# Patient Record
Sex: Female | Born: 1975 | Race: Black or African American | Hispanic: No | Marital: Single | Smoking: Never smoker
Health system: Southern US, Community
[De-identification: ages and names within clinical notes are randomized; demographics above are authoritative.]

## PROBLEM LIST (undated history)

## (undated) DIAGNOSIS — R002 Palpitations: Secondary | ICD-10-CM

## (undated) DIAGNOSIS — D649 Anemia, unspecified: Secondary | ICD-10-CM

## (undated) DIAGNOSIS — F32A Depression, unspecified: Secondary | ICD-10-CM

## (undated) DIAGNOSIS — F419 Anxiety disorder, unspecified: Secondary | ICD-10-CM

## (undated) DIAGNOSIS — Z8489 Family history of other specified conditions: Secondary | ICD-10-CM

## (undated) DIAGNOSIS — J40 Bronchitis, not specified as acute or chronic: Secondary | ICD-10-CM

## (undated) DIAGNOSIS — I10 Essential (primary) hypertension: Secondary | ICD-10-CM

## (undated) DIAGNOSIS — M1990 Unspecified osteoarthritis, unspecified site: Secondary | ICD-10-CM

## (undated) DIAGNOSIS — J020 Streptococcal pharyngitis: Secondary | ICD-10-CM

## (undated) DIAGNOSIS — R011 Cardiac murmur, unspecified: Secondary | ICD-10-CM

## (undated) DIAGNOSIS — F329 Major depressive disorder, single episode, unspecified: Secondary | ICD-10-CM

## (undated) HISTORY — PX: OTHER SURGICAL HISTORY: SHX169

## (undated) HISTORY — PX: NO PAST SURGERIES: SHX2092

## (undated) HISTORY — PX: CORONARY ANGIOPLASTY: SHX604

---

## 1997-09-15 ENCOUNTER — Encounter: Admission: RE | Admit: 1997-09-15 | Discharge: 1997-09-15 | Payer: Self-pay | Admitting: Family Medicine

## 1997-10-25 ENCOUNTER — Encounter: Admission: RE | Admit: 1997-10-25 | Discharge: 1997-10-25 | Payer: Self-pay | Admitting: Family Medicine

## 1997-11-25 ENCOUNTER — Encounter: Admission: RE | Admit: 1997-11-25 | Discharge: 1997-11-25 | Payer: Self-pay | Admitting: Family Medicine

## 1997-11-30 ENCOUNTER — Encounter: Admission: RE | Admit: 1997-11-30 | Discharge: 1997-11-30 | Payer: Self-pay | Admitting: Family Medicine

## 1997-12-03 ENCOUNTER — Encounter: Admission: RE | Admit: 1997-12-03 | Discharge: 1997-12-03 | Payer: Self-pay | Admitting: Family Medicine

## 1998-01-06 ENCOUNTER — Encounter: Admission: RE | Admit: 1998-01-06 | Discharge: 1998-01-06 | Payer: Self-pay | Admitting: Family Medicine

## 1998-01-06 ENCOUNTER — Other Ambulatory Visit: Admission: RE | Admit: 1998-01-06 | Discharge: 1998-01-06 | Payer: Self-pay | Admitting: *Deleted

## 1998-02-14 ENCOUNTER — Encounter: Admission: RE | Admit: 1998-02-14 | Discharge: 1998-02-14 | Payer: Self-pay | Admitting: Family Medicine

## 1998-02-28 ENCOUNTER — Other Ambulatory Visit: Admission: RE | Admit: 1998-02-28 | Discharge: 1998-02-28 | Payer: Self-pay | Admitting: *Deleted

## 1998-02-28 ENCOUNTER — Encounter: Admission: RE | Admit: 1998-02-28 | Discharge: 1998-02-28 | Payer: Self-pay | Admitting: Family Medicine

## 1998-07-11 ENCOUNTER — Encounter: Admission: RE | Admit: 1998-07-11 | Discharge: 1998-07-11 | Payer: Self-pay | Admitting: Family Medicine

## 1998-07-18 ENCOUNTER — Encounter: Admission: RE | Admit: 1998-07-18 | Discharge: 1998-07-18 | Payer: Self-pay | Admitting: Family Medicine

## 1998-07-21 ENCOUNTER — Encounter: Admission: RE | Admit: 1998-07-21 | Discharge: 1998-07-21 | Payer: Self-pay | Admitting: Family Medicine

## 1998-08-26 ENCOUNTER — Encounter: Admission: RE | Admit: 1998-08-26 | Discharge: 1998-08-26 | Payer: Self-pay | Admitting: Family Medicine

## 1998-09-08 ENCOUNTER — Encounter: Admission: RE | Admit: 1998-09-08 | Discharge: 1998-09-08 | Payer: Self-pay | Admitting: Family Medicine

## 1998-10-18 ENCOUNTER — Encounter: Admission: RE | Admit: 1998-10-18 | Discharge: 1998-10-18 | Payer: Self-pay | Admitting: Family Medicine

## 1998-11-08 ENCOUNTER — Encounter: Admission: RE | Admit: 1998-11-08 | Discharge: 1998-11-08 | Payer: Self-pay | Admitting: Family Medicine

## 1998-11-24 ENCOUNTER — Encounter: Admission: RE | Admit: 1998-11-24 | Discharge: 1998-11-24 | Payer: Self-pay | Admitting: Family Medicine

## 1998-12-08 ENCOUNTER — Encounter: Admission: RE | Admit: 1998-12-08 | Discharge: 1998-12-08 | Payer: Self-pay | Admitting: Family Medicine

## 1999-01-03 ENCOUNTER — Encounter: Admission: RE | Admit: 1999-01-03 | Discharge: 1999-01-03 | Payer: Self-pay | Admitting: Family Medicine

## 1999-05-18 ENCOUNTER — Encounter: Admission: RE | Admit: 1999-05-18 | Discharge: 1999-05-18 | Payer: Self-pay | Admitting: Family Medicine

## 1999-05-30 ENCOUNTER — Encounter: Admission: RE | Admit: 1999-05-30 | Discharge: 1999-05-30 | Payer: Self-pay | Admitting: Family Medicine

## 1999-06-13 ENCOUNTER — Encounter: Admission: RE | Admit: 1999-06-13 | Discharge: 1999-06-13 | Payer: Self-pay | Admitting: Sports Medicine

## 1999-08-15 ENCOUNTER — Encounter: Admission: RE | Admit: 1999-08-15 | Discharge: 1999-08-15 | Payer: Self-pay | Admitting: Sports Medicine

## 2000-01-12 ENCOUNTER — Encounter: Payer: Self-pay | Admitting: Family Medicine

## 2000-01-12 ENCOUNTER — Encounter: Admission: RE | Admit: 2000-01-12 | Discharge: 2000-01-12 | Payer: Self-pay | Admitting: Family Medicine

## 2000-02-28 ENCOUNTER — Other Ambulatory Visit: Admission: RE | Admit: 2000-02-28 | Discharge: 2000-02-28 | Payer: Self-pay | Admitting: Internal Medicine

## 2000-08-30 ENCOUNTER — Other Ambulatory Visit: Admission: RE | Admit: 2000-08-30 | Discharge: 2000-08-30 | Payer: Self-pay | Admitting: Family Medicine

## 2001-02-14 ENCOUNTER — Emergency Department (HOSPITAL_COMMUNITY): Admission: EM | Admit: 2001-02-14 | Discharge: 2001-02-14 | Payer: Self-pay | Admitting: Emergency Medicine

## 2001-03-23 ENCOUNTER — Emergency Department (HOSPITAL_COMMUNITY): Admission: EM | Admit: 2001-03-23 | Discharge: 2001-03-24 | Payer: Self-pay | Admitting: *Deleted

## 2001-09-08 ENCOUNTER — Other Ambulatory Visit: Admission: RE | Admit: 2001-09-08 | Discharge: 2001-09-08 | Payer: Self-pay | Admitting: Obstetrics and Gynecology

## 2002-01-08 ENCOUNTER — Emergency Department (HOSPITAL_COMMUNITY): Admission: EM | Admit: 2002-01-08 | Discharge: 2002-01-08 | Payer: Self-pay | Admitting: Emergency Medicine

## 2002-07-23 ENCOUNTER — Encounter: Admission: RE | Admit: 2002-07-23 | Discharge: 2002-08-10 | Payer: Self-pay | Admitting: Orthopedic Surgery

## 2002-10-19 ENCOUNTER — Emergency Department (HOSPITAL_COMMUNITY): Admission: EM | Admit: 2002-10-19 | Discharge: 2002-10-19 | Payer: Self-pay | Admitting: Emergency Medicine

## 2002-10-29 ENCOUNTER — Other Ambulatory Visit: Admission: RE | Admit: 2002-10-29 | Discharge: 2002-10-29 | Payer: Self-pay | Admitting: Family Medicine

## 2003-05-13 ENCOUNTER — Emergency Department (HOSPITAL_COMMUNITY): Admission: EM | Admit: 2003-05-13 | Discharge: 2003-05-13 | Payer: Self-pay | Admitting: Emergency Medicine

## 2003-06-20 ENCOUNTER — Emergency Department (HOSPITAL_COMMUNITY): Admission: EM | Admit: 2003-06-20 | Discharge: 2003-06-20 | Payer: Self-pay | Admitting: Emergency Medicine

## 2003-07-07 ENCOUNTER — Emergency Department (HOSPITAL_COMMUNITY): Admission: EM | Admit: 2003-07-07 | Discharge: 2003-07-07 | Payer: Self-pay | Admitting: Emergency Medicine

## 2003-09-29 ENCOUNTER — Emergency Department (HOSPITAL_COMMUNITY): Admission: EM | Admit: 2003-09-29 | Discharge: 2003-09-29 | Payer: Self-pay | Admitting: Emergency Medicine

## 2003-12-08 ENCOUNTER — Emergency Department (HOSPITAL_COMMUNITY): Admission: EM | Admit: 2003-12-08 | Discharge: 2003-12-08 | Payer: Self-pay | Admitting: Emergency Medicine

## 2004-02-23 ENCOUNTER — Emergency Department (HOSPITAL_COMMUNITY): Admission: EM | Admit: 2004-02-23 | Discharge: 2004-02-23 | Payer: Self-pay | Admitting: Emergency Medicine

## 2004-07-05 ENCOUNTER — Emergency Department (HOSPITAL_COMMUNITY): Admission: EM | Admit: 2004-07-05 | Discharge: 2004-07-05 | Payer: Self-pay | Admitting: Emergency Medicine

## 2005-01-11 ENCOUNTER — Emergency Department (HOSPITAL_COMMUNITY): Admission: EM | Admit: 2005-01-11 | Discharge: 2005-01-11 | Payer: Self-pay | Admitting: Family Medicine

## 2005-08-20 ENCOUNTER — Emergency Department (HOSPITAL_COMMUNITY): Admission: EM | Admit: 2005-08-20 | Discharge: 2005-08-21 | Payer: Self-pay | Admitting: Emergency Medicine

## 2006-02-12 ENCOUNTER — Inpatient Hospital Stay (HOSPITAL_COMMUNITY): Admission: AD | Admit: 2006-02-12 | Discharge: 2006-02-12 | Payer: Self-pay | Admitting: Obstetrics and Gynecology

## 2006-02-27 ENCOUNTER — Encounter (INDEPENDENT_AMBULATORY_CARE_PROVIDER_SITE_OTHER): Payer: Self-pay | Admitting: Obstetrics & Gynecology

## 2006-02-27 ENCOUNTER — Ambulatory Visit: Payer: Self-pay | Admitting: Obstetrics & Gynecology

## 2006-03-06 ENCOUNTER — Ambulatory Visit: Payer: Self-pay | Admitting: Obstetrics & Gynecology

## 2006-03-13 ENCOUNTER — Ambulatory Visit: Payer: Self-pay | Admitting: Gynecology

## 2006-08-25 ENCOUNTER — Emergency Department (HOSPITAL_COMMUNITY): Admission: EM | Admit: 2006-08-25 | Discharge: 2006-08-25 | Payer: Self-pay | Admitting: Emergency Medicine

## 2006-09-07 ENCOUNTER — Emergency Department (HOSPITAL_COMMUNITY): Admission: EM | Admit: 2006-09-07 | Discharge: 2006-09-07 | Payer: Self-pay | Admitting: Family Medicine

## 2006-11-02 ENCOUNTER — Emergency Department (HOSPITAL_COMMUNITY): Admission: EM | Admit: 2006-11-02 | Discharge: 2006-11-02 | Payer: Self-pay | Admitting: Emergency Medicine

## 2006-12-03 ENCOUNTER — Emergency Department (HOSPITAL_COMMUNITY): Admission: EM | Admit: 2006-12-03 | Discharge: 2006-12-03 | Payer: Self-pay | Admitting: Emergency Medicine

## 2007-02-17 ENCOUNTER — Encounter (INDEPENDENT_AMBULATORY_CARE_PROVIDER_SITE_OTHER): Payer: Self-pay | Admitting: *Deleted

## 2007-04-02 ENCOUNTER — Ambulatory Visit: Payer: Self-pay | Admitting: Obstetrics & Gynecology

## 2007-04-02 ENCOUNTER — Encounter: Payer: Self-pay | Admitting: Obstetrics & Gynecology

## 2007-07-10 ENCOUNTER — Emergency Department (HOSPITAL_COMMUNITY): Admission: EM | Admit: 2007-07-10 | Discharge: 2007-07-10 | Payer: Self-pay | Admitting: Emergency Medicine

## 2008-07-01 ENCOUNTER — Emergency Department (HOSPITAL_COMMUNITY): Admission: EM | Admit: 2008-07-01 | Discharge: 2008-07-01 | Payer: Self-pay | Admitting: Emergency Medicine

## 2008-10-19 ENCOUNTER — Emergency Department (HOSPITAL_COMMUNITY): Admission: EM | Admit: 2008-10-19 | Discharge: 2008-10-19 | Payer: Self-pay | Admitting: Family Medicine

## 2009-07-20 ENCOUNTER — Emergency Department (HOSPITAL_COMMUNITY): Admission: EM | Admit: 2009-07-20 | Discharge: 2009-07-20 | Payer: Self-pay | Admitting: Emergency Medicine

## 2009-11-09 ENCOUNTER — Emergency Department (HOSPITAL_COMMUNITY): Admission: EM | Admit: 2009-11-09 | Discharge: 2009-11-09 | Payer: Self-pay | Admitting: Family Medicine

## 2009-11-12 ENCOUNTER — Emergency Department (HOSPITAL_COMMUNITY): Admission: EM | Admit: 2009-11-12 | Discharge: 2009-11-12 | Payer: Self-pay | Admitting: Emergency Medicine

## 2010-06-05 ENCOUNTER — Emergency Department (HOSPITAL_COMMUNITY)
Admission: EM | Admit: 2010-06-05 | Discharge: 2010-06-05 | Payer: Self-pay | Source: Home / Self Care | Admitting: Emergency Medicine

## 2010-06-06 LAB — CBC
HCT: 38.1 % (ref 36.0–46.0)
Hemoglobin: 12.9 g/dL (ref 12.0–15.0)
MCH: 29.9 pg (ref 26.0–34.0)
MCHC: 33.9 g/dL (ref 30.0–36.0)
MCV: 88.2 fL (ref 78.0–100.0)
Platelets: 322 10*3/uL (ref 150–400)
RBC: 4.32 MIL/uL (ref 3.87–5.11)
RDW: 13.2 % (ref 11.5–15.5)
WBC: 9.9 10*3/uL (ref 4.0–10.5)

## 2010-06-06 LAB — COMPREHENSIVE METABOLIC PANEL
ALT: 11 U/L (ref 0–35)
AST: 13 U/L (ref 0–37)
Albumin: 3.7 g/dL (ref 3.5–5.2)
Alkaline Phosphatase: 73 U/L (ref 39–117)
BUN: 5 mg/dL — ABNORMAL LOW (ref 6–23)
CO2: 27 mEq/L (ref 19–32)
Calcium: 9.2 mg/dL (ref 8.4–10.5)
Chloride: 104 mEq/L (ref 96–112)
Creatinine, Ser: 0.83 mg/dL (ref 0.4–1.2)
GFR calc Af Amer: >60 mL/min (ref >60)
GFR calc non Af Amer: >60 mL/min (ref >60)
Glucose, Bld: 97 mg/dL (ref 70–99)
Potassium: 3.8 mEq/L (ref 3.5–5.1)
Sodium: 138 mEq/L (ref 135–145)
Total Bilirubin: 0.4 mg/dL (ref 0.3–1.2)
Total Protein: 8 g/dL (ref 6.0–8.3)

## 2010-06-06 LAB — DIFFERENTIAL
Basophils Absolute: 0 10*3/uL (ref 0.0–0.1)
Basophils Relative: 0 % (ref 0–1)
Eosinophils Absolute: 0.1 10*3/uL (ref 0.0–0.7)
Eosinophils Relative: 1 % (ref 0–5)
Lymphocytes Relative: 17 % (ref 12–46)
Lymphs Abs: 1.7 10*3/uL (ref 0.7–4.0)
Monocytes Absolute: 0.8 10*3/uL (ref 0.1–1.0)
Monocytes Relative: 8 % (ref 3–12)
Neutro Abs: 7.3 10*3/uL (ref 1.7–7.7)
Neutrophils Relative %: 74 % (ref 43–77)

## 2010-06-06 LAB — URINALYSIS, ROUTINE W REFLEX MICROSCOPIC
Bilirubin Urine: NEGATIVE
Hgb urine dipstick: NEGATIVE
Ketones, ur: NEGATIVE mg/dL
Nitrite: NEGATIVE
Protein, ur: NEGATIVE mg/dL
Specific Gravity, Urine: 1.015 (ref 1.005–1.030)
Urine Glucose, Fasting: NEGATIVE mg/dL
Urobilinogen, UA: 0.2 mg/dL (ref 0.0–1.0)
pH: 7 (ref 5.0–8.0)

## 2010-06-06 LAB — WET PREP, GENITAL
Trich, Wet Prep: NONE SEEN
Yeast Wet Prep HPF POC: NONE SEEN

## 2010-06-06 LAB — POCT PREGNANCY, URINE: Preg Test, Ur: NEGATIVE

## 2010-06-06 LAB — URINE MICROSCOPIC-ADD ON

## 2010-06-06 LAB — LIPASE, BLOOD: Lipase: 20 U/L (ref 11–59)

## 2010-06-07 LAB — GC/CHLAMYDIA PROBE AMP, GENITAL
Chlamydia, DNA Probe: NEGATIVE
GC Probe Amp, Genital: NEGATIVE

## 2010-07-30 LAB — POCT URINALYSIS DIP (DEVICE)
Bilirubin Urine: NEGATIVE
Glucose, UA: NEGATIVE mg/dL
Hgb urine dipstick: NEGATIVE
Ketones, ur: NEGATIVE mg/dL
Nitrite: NEGATIVE
Protein, ur: NEGATIVE mg/dL
Specific Gravity, Urine: 1.01 (ref 1.005–1.030)
Urobilinogen, UA: 0.2 mg/dL (ref 0.0–1.0)
pH: 5.5 (ref 5.0–8.0)

## 2010-07-30 LAB — POCT PREGNANCY, URINE: Preg Test, Ur: NEGATIVE

## 2010-08-06 LAB — CBC
HCT: 40.3 % (ref 36.0–46.0)
Hemoglobin: 13.5 g/dL (ref 12.0–15.0)
MCHC: 33.6 g/dL (ref 30.0–36.0)
MCV: 92.2 fL (ref 78.0–100.0)
Platelets: 331 10*3/uL (ref 150–400)
RBC: 4.37 MIL/uL (ref 3.87–5.11)
RDW: 12.6 % (ref 11.5–15.5)
WBC: 12.8 10*3/uL — ABNORMAL HIGH (ref 4.0–10.5)

## 2010-08-06 LAB — URINALYSIS, ROUTINE W REFLEX MICROSCOPIC
Bilirubin Urine: NEGATIVE
Glucose, UA: NEGATIVE mg/dL
Hgb urine dipstick: NEGATIVE
Ketones, ur: NEGATIVE mg/dL
Nitrite: NEGATIVE
Protein, ur: NEGATIVE mg/dL
Specific Gravity, Urine: 1.003 — ABNORMAL LOW (ref 1.005–1.030)
Urobilinogen, UA: 0.2 mg/dL (ref 0.0–1.0)
pH: 6.5 (ref 5.0–8.0)

## 2010-08-06 LAB — DIFFERENTIAL
Basophils Absolute: 0.1 10*3/uL (ref 0.0–0.1)
Basophils Relative: 1 % (ref 0–1)
Eosinophils Absolute: 0.1 10*3/uL (ref 0.0–0.7)
Eosinophils Relative: 1 % (ref 0–5)
Lymphocytes Relative: 19 % (ref 12–46)
Lymphs Abs: 2.4 10*3/uL (ref 0.7–4.0)
Monocytes Absolute: 1.3 10*3/uL — ABNORMAL HIGH (ref 0.1–1.0)
Monocytes Relative: 10 % (ref 3–12)
Neutro Abs: 8.9 10*3/uL — ABNORMAL HIGH (ref 1.7–7.7)
Neutrophils Relative %: 70 % (ref 43–77)

## 2010-08-06 LAB — PREGNANCY, URINE: Preg Test, Ur: NEGATIVE

## 2010-08-06 LAB — COMPREHENSIVE METABOLIC PANEL
ALT: 16 U/L (ref 0–35)
AST: 17 U/L (ref 0–37)
Albumin: 3.8 g/dL (ref 3.5–5.2)
Alkaline Phosphatase: 75 U/L (ref 39–117)
BUN: 4 mg/dL — ABNORMAL LOW (ref 6–23)
CO2: 31 mEq/L (ref 19–32)
Calcium: 9.4 mg/dL (ref 8.4–10.5)
Chloride: 101 mEq/L (ref 96–112)
Creatinine, Ser: 0.76 mg/dL (ref 0.4–1.2)
GFR calc Af Amer: >60 mL/min (ref >60)
GFR calc non Af Amer: >60 mL/min (ref >60)
Glucose, Bld: 99 mg/dL (ref 70–99)
Potassium: 2.8 mEq/L — ABNORMAL LOW (ref 3.5–5.1)
Sodium: 138 mEq/L (ref 135–145)
Total Bilirubin: 0.4 mg/dL (ref 0.3–1.2)
Total Protein: 7.9 g/dL (ref 6.0–8.3)

## 2010-08-06 LAB — LIPASE, BLOOD: Lipase: 26 U/L (ref 11–59)

## 2010-08-21 LAB — POCT RAPID STREP A (OFFICE): Streptococcus, Group A Screen (Direct): NEGATIVE

## 2010-08-29 LAB — CBC
HCT: 41.2 % (ref 36.0–46.0)
Hemoglobin: 13.9 g/dL (ref 12.0–15.0)
MCHC: 33.7 g/dL (ref 30.0–36.0)
MCV: 92.5 fL (ref 78.0–100.0)
Platelets: 293 10*3/uL (ref 150–400)
RBC: 4.46 MIL/uL (ref 3.87–5.11)
RDW: 12.5 % (ref 11.5–15.5)
WBC: 13.5 10*3/uL — ABNORMAL HIGH (ref 4.0–10.5)

## 2010-08-29 LAB — DIFFERENTIAL
Basophils Absolute: 0.3 10*3/uL — ABNORMAL HIGH (ref 0.0–0.1)
Basophils Relative: 2 % — ABNORMAL HIGH (ref 0–1)
Eosinophils Absolute: 0.1 10*3/uL (ref 0.0–0.7)
Eosinophils Relative: 1 % (ref 0–5)
Lymphocytes Relative: 16 % (ref 12–46)
Lymphs Abs: 2.1 10*3/uL (ref 0.7–4.0)
Monocytes Absolute: 1.4 10*3/uL — ABNORMAL HIGH (ref 0.1–1.0)
Monocytes Relative: 11 % (ref 3–12)
Neutro Abs: 9.6 10*3/uL — ABNORMAL HIGH (ref 1.7–7.7)
Neutrophils Relative %: 71 % (ref 43–77)

## 2010-08-29 LAB — URINALYSIS, ROUTINE W REFLEX MICROSCOPIC
Bilirubin Urine: NEGATIVE
Glucose, UA: NEGATIVE mg/dL
Ketones, ur: NEGATIVE mg/dL
Nitrite: NEGATIVE
Protein, ur: NEGATIVE mg/dL
Specific Gravity, Urine: 1.007 (ref 1.005–1.030)
Urobilinogen, UA: 0.2 mg/dL (ref 0.0–1.0)
pH: 7 (ref 5.0–8.0)

## 2010-08-29 LAB — GC/CHLAMYDIA PROBE AMP, GENITAL
Chlamydia, DNA Probe: NEGATIVE
GC Probe Amp, Genital: NEGATIVE

## 2010-08-29 LAB — POCT PREGNANCY, URINE: Preg Test, Ur: NEGATIVE

## 2010-08-29 LAB — WET PREP, GENITAL
Trich, Wet Prep: NONE SEEN
Yeast Wet Prep HPF POC: NONE SEEN

## 2010-08-29 LAB — POCT I-STAT, CHEM 8
BUN: 6 mg/dL (ref 6–23)
Calcium, Ion: 1.19 mmol/L (ref 1.12–1.32)
Chloride: 105 mEq/L (ref 96–112)
Creatinine, Ser: 1 mg/dL (ref 0.4–1.2)
Glucose, Bld: 86 mg/dL (ref 70–99)
HCT: 43 % (ref 36.0–46.0)
Hemoglobin: 14.6 g/dL (ref 12.0–15.0)
Potassium: 5 mEq/L (ref 3.5–5.1)
Sodium: 140 mEq/L (ref 135–145)
TCO2: 27 mmol/L (ref 0–100)

## 2010-08-29 LAB — URINE MICROSCOPIC-ADD ON

## 2010-09-26 NOTE — Group Therapy Note (Signed)
NAMEKATRINA, [name] NO.:  [id number]   MEDICAL RECORD NO.:  [id number]          PATIENT TYPE:  WOC   LOCATION:  [facility]                   FACILITY:  [facility]   PHYSICIAN:  Johnella Moloney, MD        DATE OF BIRTH:  1976/04/09   DATE OF SERVICE:                                  CLINIC NOTE   CHIEF COMPLAINT:  The patient is here for yearly examination.   HISTORY OF PRESENT ILLNESS:  The patient is a 35 year old gravida 2/para  0/0-0-2-0, who is here for her yearly exam.  The patient was last seen  after a miscarriage in October 2007, after which she was prescribed Yaz  for contraception.  The patient reports not taking her oral  contraceptive pills starting about 3 months ago, given her side effects  that she described as her hands being numb and tingly.  The patient also  desires pregnancy at this point.  Her last menstrual period was on  02/13/2007 and she is wanting a pregnancy test today.  Otherwise, the  patient has no concerns.  She is in a monogamous sexual relationship,  but desires an STD panel.  She just wants to make sure everything is  okay.   PAST MEDICAL HISTORY:  Depression, hypertension.   PAST SURGICAL HISTORY:  None.   PAST OBSTETRICAL HISTORY:  One miscarriage and 1 termination.  Normal  menstrual periods.  No contraception currently.  History of abnormal Pap  smear in the past, with normal Pap smears following.   MEDICATIONS:  1. Zoloft 100 mg p.o. every day.  2. Trazodone 100 mg p.o. nightly.  3. Hydrochlorothiazide 12.5 mg p.o. every day.   ALLERGIES:  No known drug allergies.   SOCIAL HISTORY:  The patient denies smoking, alcohol, or any illicit  drugs.  She also denies abuse.   FAMILY HISTORY:  Heart disease and diabetes.   REVIEW OF SYSTEMS:  The patient has no other concerns.   PHYSICAL EXAMINATION:  VITAL SIGNS:  Blood pressure is 160/92,  temperature 98.6, pulse 80, weight 195.7 pounds, height 5 foot, 4-1/2  inches.  GENERAL:  No apparent distress.  BREASTS:  Symmetric in size, pendulous.  No abnormal masses palpated.  No skin changes or drainage.  ABDOMEN:  Soft, nontender, nondistended.  Obese.  PELVIC:  Normal external female genitalia.  Pink/white rugated vagina.  Nulliparous cervix.  No lesions or abnormal drainage.  Pap smear and  GC/chlamydia probe done.  On bimanual, unable to palpate uterus,  secondary to habitus.  Also, unable to palpate adnexa.  No tenderness on  examination.   ASSESSMENT AND PLAN:  The patient is a 35 year old G2/P0/0-0-2-0, here  for annual exam.  Pap smear, gonorrhea, and chlamydia probe done.  We  will follow with results.  The patient is also to get an STD panel to be  drawn today.  Of note, the patient did have a urine pregnancy test today  that was negative.  She was told to return for evaluation if her  amenorrhea persists.  We will also check a serum HCG for further  evaluation.  The patient was  also told to follow up with her primary  care physician, given her elevated blood pressure at this visit.  She  does say she did not take her blood pressure medication today and she  was told that it was important to be adherent to her medications, due to  increased risk of stroke with uncontrolled hypertension.  The patient  verbalized an understanding of plan.           ______________________________  Johnella Moloney, MD     UD/MEDQ  D:  04/02/2007  T:  04/03/2007  Job:  [id number]

## 2010-09-29 NOTE — Group Therapy Note (Signed)
[name], NOLDEN NO.:  [id number]   MEDICAL RECORD NO.:  [id number]          PATIENT TYPE:  WOC   LOCATION:  [facility]                   FACILITY:  [facility]   PHYSICIAN:  Dorthula Perfect, MD     DATE OF BIRTH:  [birth year]   DATE OF SERVICE:  02/27/2006                                    CLINIC NOTE   A 35 year old black female gravida 2, therapeutic abort at 1, miscarriage 1  was seen here October 2 with an 8 week, 6 day intrauterine demise.  Quantitative HCG was 4557. If I understand the story, she was offered a D&E  but apparently passed some at home followed by passing the sac late last  week. She has no vaginal bleeding now. She is here for followup. The  ultrasound that she had done to help with the diagnosis revealed the  gestation of 8 weeks 6 days without cardiac activity.   PHYSICAL EXAMINATION:  VITAL SIGNS:  Blood pressure 142/88, weight 197  pounds.  ABDOMEN:  Soft, nontender.  PELVIC:  External genitalia __________ epithelized as well as the cervix.  She is having no vaginal bleeding. Uterus is in the midline and is of normal  size and shape. Adnexal structures are completely normal. No masses or  tenderness.   IMPRESSION:  Status post missed abortion probably spontaneous.   DISPOSITION:  1. Pap smears done.  2. Quantitative HCG will be done.  3. If the quantitative HCG is normal she will then start Yaz, and is given      a prescription for Yaz 28 with 12 refills. She herself mentioned Yaz      because she is somewhat of a hairy female and does have some degree of      an acne problem.           ______________________________  Dorthula Perfect, MD     ER/MEDQ  D:  02/27/2006  T:  03/01/2006  Job:  [id number]

## 2010-12-11 ENCOUNTER — Emergency Department (HOSPITAL_COMMUNITY)
Admission: EM | Admit: 2010-12-11 | Discharge: 2010-12-11 | Disposition: A | Discharge status: Home / Self Care | Payer: Medicaid Other | Attending: Emergency Medicine | Admitting: Emergency Medicine

## 2010-12-11 ENCOUNTER — Emergency Department (HOSPITAL_COMMUNITY): Payer: Medicaid Other

## 2010-12-11 DIAGNOSIS — J40 Bronchitis, not specified as acute or chronic: Secondary | ICD-10-CM | POA: Insufficient documentation

## 2010-12-11 DIAGNOSIS — R5381 Other malaise: Secondary | ICD-10-CM | POA: Insufficient documentation

## 2010-12-11 DIAGNOSIS — F341 Dysthymic disorder: Secondary | ICD-10-CM | POA: Insufficient documentation

## 2010-12-11 DIAGNOSIS — R059 Cough, unspecified: Secondary | ICD-10-CM | POA: Insufficient documentation

## 2010-12-11 DIAGNOSIS — F319 Bipolar disorder, unspecified: Secondary | ICD-10-CM | POA: Insufficient documentation

## 2010-12-11 DIAGNOSIS — Z79899 Other long term (current) drug therapy: Secondary | ICD-10-CM | POA: Insufficient documentation

## 2010-12-11 DIAGNOSIS — R5383 Other fatigue: Secondary | ICD-10-CM | POA: Insufficient documentation

## 2010-12-11 DIAGNOSIS — R109 Unspecified abdominal pain: Secondary | ICD-10-CM | POA: Insufficient documentation

## 2010-12-11 DIAGNOSIS — R05 Cough: Secondary | ICD-10-CM | POA: Insufficient documentation

## 2010-12-11 DIAGNOSIS — I10 Essential (primary) hypertension: Secondary | ICD-10-CM | POA: Insufficient documentation

## 2010-12-11 LAB — DIFFERENTIAL
Band Neutrophils: 0 % (ref 0–10)
Basophils Absolute: 0 10*3/uL (ref 0.0–0.1)
Basophils Relative: 0 % (ref 0–1)
Blasts: 0 %
Eosinophils Absolute: 0 10*3/uL (ref 0.0–0.7)
Eosinophils Relative: 0 % (ref 0–5)
Lymphocytes Relative: 11 % — ABNORMAL LOW (ref 12–46)
Lymphs Abs: 1.3 10*3/uL (ref 0.7–4.0)
Metamyelocytes Relative: 0 %
Monocytes Absolute: 0.4 10*3/uL (ref 0.1–1.0)
Monocytes Relative: 3 % (ref 3–12)
Myelocytes: 0 %
Neutro Abs: 10.3 10*3/uL — ABNORMAL HIGH (ref 1.7–7.7)
Neutrophils Relative %: 86 % — ABNORMAL HIGH (ref 43–77)
Promyelocytes Absolute: 0 %
nRBC: 0 /100 WBC

## 2010-12-11 LAB — COMPREHENSIVE METABOLIC PANEL
ALT: 9 U/L (ref 0–35)
AST: 12 U/L (ref 0–37)
Albumin: 3.6 g/dL (ref 3.5–5.2)
Alkaline Phosphatase: 80 U/L (ref 39–117)
BUN: 7 mg/dL (ref 6–23)
CO2: 30 mEq/L (ref 19–32)
Calcium: 9.4 mg/dL (ref 8.4–10.5)
Chloride: 103 mEq/L (ref 96–112)
Creatinine, Ser: 0.81 mg/dL (ref 0.50–1.10)
GFR calc Af Amer: >60 mL/min (ref >60)
GFR calc non Af Amer: >60 mL/min (ref >60)
Glucose, Bld: 95 mg/dL (ref 70–99)
Potassium: 3.3 mEq/L — ABNORMAL LOW (ref 3.5–5.1)
Sodium: 139 mEq/L (ref 135–145)
Total Bilirubin: 0.1 mg/dL — ABNORMAL LOW (ref 0.3–1.2)
Total Protein: 7.6 g/dL (ref 6.0–8.3)

## 2010-12-11 LAB — URINE MICROSCOPIC-ADD ON

## 2010-12-11 LAB — CBC
HCT: 36.5 % (ref 36.0–46.0)
Hemoglobin: 12.4 g/dL (ref 12.0–15.0)
MCH: 29.5 pg (ref 26.0–34.0)
MCHC: 34 g/dL (ref 30.0–36.0)
MCV: 86.7 fL (ref 78.0–100.0)
Platelets: 317 10*3/uL (ref 150–400)
RBC: 4.21 MIL/uL (ref 3.87–5.11)
RDW: 13 % (ref 11.5–15.5)
WBC: 12 10*3/uL — ABNORMAL HIGH (ref 4.0–10.5)

## 2010-12-11 LAB — URINALYSIS, ROUTINE W REFLEX MICROSCOPIC
Bilirubin Urine: NEGATIVE
Glucose, UA: NEGATIVE mg/dL
Ketones, ur: NEGATIVE mg/dL
Nitrite: NEGATIVE
Protein, ur: NEGATIVE mg/dL
Specific Gravity, Urine: 1.016 (ref 1.005–1.030)
Urobilinogen, UA: 0.2 mg/dL (ref 0.0–1.0)
pH: 6.5 (ref 5.0–8.0)

## 2010-12-11 LAB — LIPASE, BLOOD: Lipase: 28 U/L (ref 11–59)

## 2010-12-11 LAB — POCT PREGNANCY, URINE: Preg Test, Ur: NEGATIVE

## 2011-02-02 LAB — URINALYSIS, ROUTINE W REFLEX MICROSCOPIC
Bilirubin Urine: NEGATIVE
Glucose, UA: NEGATIVE
Ketones, ur: NEGATIVE
Nitrite: NEGATIVE
Protein, ur: NEGATIVE
Specific Gravity, Urine: 1.016
Urobilinogen, UA: 0.2
pH: 5.5

## 2011-02-02 LAB — BASIC METABOLIC PANEL
BUN: 7
CO2: 26
Calcium: 9.3
Chloride: 104
Creatinine, Ser: 0.72
GFR calc Af Amer: >60
GFR calc non Af Amer: >60
Glucose, Bld: 92
Potassium: 4
Sodium: 137

## 2011-02-02 LAB — DIFFERENTIAL
Basophils Absolute: 0
Basophils Relative: 0
Eosinophils Absolute: 0.1
Eosinophils Relative: 1
Lymphocytes Relative: 20
Lymphs Abs: 2.5
Monocytes Absolute: 1.5 — ABNORMAL HIGH
Monocytes Relative: 11
Neutro Abs: 8.8 — ABNORMAL HIGH
Neutrophils Relative %: 68

## 2011-02-02 LAB — URINE MICROSCOPIC-ADD ON

## 2011-02-02 LAB — HEPATIC FUNCTION PANEL
ALT: 14
AST: 16
Albumin: 3.7
Alkaline Phosphatase: 73
Bilirubin, Direct: 0.1
Indirect Bilirubin: 0.3
Total Bilirubin: 0.4
Total Protein: 7.9

## 2011-02-02 LAB — CBC
HCT: 36.9
Hemoglobin: 12.7
MCHC: 34.4
MCV: 87.6
Platelets: 305
RBC: 4.21
RDW: 13.7
WBC: 12.9 — ABNORMAL HIGH

## 2011-02-02 LAB — PREGNANCY, URINE: Preg Test, Ur: NEGATIVE

## 2011-02-02 LAB — LIPASE, BLOOD: Lipase: 24

## 2011-02-20 LAB — POCT PREGNANCY, URINE
Operator id: 297281
Preg Test, Ur: NEGATIVE

## 2011-02-28 LAB — POCT PREGNANCY, URINE
Operator id: 10282
Preg Test, Ur: NEGATIVE

## 2011-04-16 ENCOUNTER — Other Ambulatory Visit: Payer: Self-pay

## 2011-04-16 ENCOUNTER — Emergency Department (HOSPITAL_COMMUNITY)
Admission: EM | Admit: 2011-04-16 | Discharge: 2011-04-16 | Disposition: A | Discharge status: Home / Self Care | Payer: Medicaid Other | Attending: Emergency Medicine | Admitting: Emergency Medicine

## 2011-04-16 DIAGNOSIS — N949 Unspecified condition associated with female genital organs and menstrual cycle: Secondary | ICD-10-CM | POA: Insufficient documentation

## 2011-04-16 DIAGNOSIS — R109 Unspecified abdominal pain: Secondary | ICD-10-CM | POA: Insufficient documentation

## 2011-04-16 DIAGNOSIS — R350 Frequency of micturition: Secondary | ICD-10-CM | POA: Insufficient documentation

## 2011-04-16 DIAGNOSIS — R102 Pelvic and perineal pain: Secondary | ICD-10-CM

## 2011-04-16 DIAGNOSIS — R209 Unspecified disturbances of skin sensation: Secondary | ICD-10-CM | POA: Insufficient documentation

## 2011-04-16 DIAGNOSIS — I10 Essential (primary) hypertension: Secondary | ICD-10-CM | POA: Insufficient documentation

## 2011-04-16 DIAGNOSIS — N898 Other specified noninflammatory disorders of vagina: Secondary | ICD-10-CM | POA: Insufficient documentation

## 2011-04-16 HISTORY — DX: Essential (primary) hypertension: I10

## 2011-04-16 HISTORY — DX: Anxiety disorder, unspecified: F41.9

## 2011-04-16 HISTORY — DX: Depression, unspecified: F32.A

## 2011-04-16 HISTORY — DX: Major depressive disorder, single episode, unspecified: F32.9

## 2011-04-16 LAB — URINALYSIS, MICROSCOPIC ONLY
Bilirubin Urine: NEGATIVE
Glucose, UA: NEGATIVE mg/dL
Hgb urine dipstick: NEGATIVE
Ketones, ur: NEGATIVE mg/dL
Nitrite: NEGATIVE
Protein, ur: NEGATIVE mg/dL
Specific Gravity, Urine: 1.006 (ref 1.005–1.030)
Urobilinogen, UA: 0.2 mg/dL (ref 0.0–1.0)
pH: 6 (ref 5.0–8.0)

## 2011-04-16 LAB — PREGNANCY, URINE: Preg Test, Ur: NEGATIVE

## 2011-04-16 LAB — WET PREP, GENITAL
Clue Cells Wet Prep HPF POC: NONE SEEN
Trich, Wet Prep: NONE SEEN
Yeast Wet Prep HPF POC: NONE SEEN

## 2011-04-16 NOTE — ED Notes (Signed)
Delivered urine to mini lab with hand written req.  Original failed to print

## 2011-04-16 NOTE — ED Notes (Signed)
Pt. Reports symptoms began over 1 month ago,. Multiple symptoms., vag. Discharge, abdominal pain, intermittent tingling,  Intermittent chest squeezing,

## 2011-04-16 NOTE — ED Provider Notes (Signed)
Medical screening examination/treatment/procedure(s) were performed by non-physician practitioner and as supervising physician I was immediately available for consultation/collaboration.  Nicholes Stairs, MD 04/16/11 2024

## 2011-04-16 NOTE — ED Provider Notes (Signed)
4:28 PM Signout received from [place], [place]. Wet prep negative for clue cells, yeast, trich. Will d/c home with instruction to f/u with PCP/GYN. Pt verbalized understanding and agreed to plan.  Grant Fontana, [place] 04/16/11 [phone]

## 2011-04-16 NOTE — ED Provider Notes (Signed)
History     CSN: [id number] Arrival date & time: 04/16/2011 10:07 AM   First MD Initiated Contact with Patient 04/16/11 1145      Chief Complaint  Patient presents with  . Vaginal Discharge    vaginal discharge brown, lower abdominal pain, intermittent chest squeezing,  fingers and rt. arm intermittent tingling. UTI symptoms, dsyuria    (Consider location/radiation/quality/duration/timing/severity/associated sxs/prior treatment) HPI Comments: Patient reports she has been having sharp lower abdominal pain with pressure and cramps for 3-4 months.  She has also had urinary frequency and vaginal discharge.  LMP Nov 23.  Pt is also concerned because she has intermittent squeezing sensation in her chest that lasts seconds and has been occuring for several months.  This pain is not related to exertion.  Pt has seen her PCP for all of these complaints in the past and has had normal workups.  States none of the complaints are any worse or changed in quality, but she wanted to get rechecked.  Denies fever, vomiting, change in bowel habits, shortness of breath, or cough.    Patient is a 35 y.o. female presenting with vaginal discharge. The history is provided by the patient.  Vaginal Discharge Pertinent negatives include no fever.    Past Medical History  Diagnosis Date  . Hypertension   . Anxiety   . Depressed     History reviewed. No pertinent past surgical history.  History reviewed. No pertinent family history.  History  Substance Use Topics  . Smoking status: Never Smoker   . Smokeless tobacco: Not on file  . Alcohol Use: No    OB History    Grav Para Term Preterm Abortions TAB SAB Ect Mult Living                  Review of Systems  Constitutional: Negative for fever and activity change.  Genitourinary: Positive for vaginal discharge.  Musculoskeletal:       Notes that when it is cold outside her fingers sometimes turn white and sting.    All other systems reviewed and  are negative.    Allergies  Review of patient's allergies indicates no known allergies.  Home Medications   Current Outpatient Rx  Name Route Sig Dispense Refill  . HYDROCHLOROTHIAZIDE 25 MG PO TABS Oral Take 25 mg by mouth daily.     Marland Kitchen LISINOPRIL 20 MG PO TABS Oral Take 20 mg by mouth daily.     . SERTRALINE HCL 100 MG PO TABS Oral Take 100 mg by mouth daily.        BP 124/73  Pulse 88  Temp(Src) 98.7 F (37.1 C) (Oral)  Resp 14  Ht 5\' 6"  (1.676 m)  Wt 190 lb (86.183 kg)  BMI 30.67 kg/m2  SpO2 99%  LMP 04/05/2011  Physical Exam  Constitutional: She is oriented to person, place, and time. She appears well-developed and well-nourished.  HENT:  Head: Normocephalic and atraumatic.  Neck: Neck supple.  Cardiovascular: Normal rate, regular rhythm and normal heart sounds.   Pulmonary/Chest: Breath sounds normal. No respiratory distress. She has no wheezes. She has no rales. She exhibits no tenderness.  Abdominal: Soft. Bowel sounds are normal. She exhibits no distension and no mass. There is no tenderness. There is no rebound and no guarding.  Genitourinary: Vagina normal and uterus normal. Cervix exhibits no motion tenderness, no discharge and no friability. Right adnexum displays no mass, no tenderness and no fullness. Left adnexum displays no mass, no tenderness and  no fullness. No vaginal discharge found.       Exam somewhat limited by body habitus.   Neurological: She is alert and oriented to person, place, and time.    ED Course  Procedures (including critical care time)  Labs Reviewed  URINALYSIS, MICROSCOPIC ONLY - Abnormal; Notable for the following:    Leukocytes, UA SMALL (*)    Bacteria, UA FEW (*)    Squamous Epithelial / LPF FEW (*)    All other components within normal limits  PREGNANCY, URINE  WET PREP, GENITAL  GC/CHLAMYDIA PROBE AMP, GENITAL  URINALYSIS, ROUTINE W REFLEX MICROSCOPIC  PREGNANCY, URINE   No results found.   Date: 04/16/2011  Rate:  67  Rhythm: normal sinus rhythm  QRS Axis: normal  Intervals: normal  ST/T Wave abnormalities: normal  Conduction Disutrbances:none  Narrative Interpretation:   Old EKG Reviewed: no significant changes    No diagnosis found.    MDM  Patient with chronic pelvic and chest complaint, EKG normal, no current chest pain.  Abdominal and pelvic exam unremarkable.  Pt signed out to Grant Fontana, PA-C, at change of shift pending wet prep.         Dillard Cannon Crisman, [place] 04/16/11 [phone]

## 2011-04-16 NOTE — ED Provider Notes (Signed)
Medical screening examination/treatment/procedure(s) were performed by non-physician practitioner and as supervising physician I was immediately available for consultation/collaboration.  Nicholes Stairs, MD 04/16/11 2023

## 2011-04-16 NOTE — ED Notes (Signed)
Pt states pain in lower abdomin for over 1 month.  Denies fever but states she has had nausea no vomiting.  Gets dizzy when standing. Pain did not ease off after cycle. Vaginal discharge is reddish brown, no odor.  Pt states urine smells strong.

## 2011-04-17 LAB — URINE CULTURE: Culture  Setup Time: 201212031700

## 2011-04-17 LAB — GC/CHLAMYDIA PROBE AMP, GENITAL
Chlamydia, DNA Probe: NEGATIVE
GC Probe Amp, Genital: NEGATIVE

## 2011-08-20 ENCOUNTER — Emergency Department (HOSPITAL_COMMUNITY)
Admission: EM | Admit: 2011-08-20 | Discharge: 2011-08-20 | Disposition: A | Discharge status: Home / Self Care | Payer: Medicaid Other | Source: Home / Self Care | Attending: Emergency Medicine | Admitting: Emergency Medicine

## 2011-08-20 ENCOUNTER — Encounter (HOSPITAL_COMMUNITY): Payer: Self-pay | Admitting: *Deleted

## 2011-08-20 DIAGNOSIS — J020 Streptococcal pharyngitis: Secondary | ICD-10-CM

## 2011-08-20 HISTORY — DX: Streptococcal pharyngitis: J02.0

## 2011-08-20 HISTORY — DX: Bronchitis, not specified as acute or chronic: J40

## 2011-08-20 HISTORY — DX: Cardiac murmur, unspecified: R01.1

## 2011-08-20 LAB — POCT RAPID STREP A: Streptococcus, Group A Screen (Direct): POSITIVE — AB

## 2011-08-20 MED ORDER — PENICILLIN V POTASSIUM 500 MG PO TABS: 500.0000 mg | ORAL_TABLET | Freq: Three times a day (TID) | ORAL | Status: AC

## 2011-08-20 MED ORDER — HYDROCODONE-ACETAMINOPHEN 5-325 MG PO TABS: 2.0000 | ORAL_TABLET | ORAL | Status: AC | PRN

## 2011-08-20 MED ORDER — LIDOCAINE VISCOUS 2 % MT SOLN: 10.0000 mL | Freq: Three times a day (TID) | OROMUCOSAL | Status: AC | PRN

## 2011-08-20 MED ORDER — IBUPROFEN 600 MG PO TABS: 600.0000 mg | ORAL_TABLET | Freq: Four times a day (QID) | ORAL | Status: AC | PRN

## 2011-08-20 NOTE — Discharge Instructions (Signed)
Take the medication as written. Take 1 gram of tylenol with the motrin up to 4 times a day as needed for pain and fever. This is an effective combination for pain. Take the hydrocodone/norco only for severe pain. Do not take the tylenol and hydrocodone/norcoas they both have tylenol in them and too much can hurt your liver. Return if you get worse, have a  fever >100.4, or for any concerns.   Go to www.goodrx.com to look up your medications. This will give you a list of where you can find your prescriptions at the most affordable prices.   

## 2011-08-20 NOTE — ED Notes (Signed)
C/O sore throat, anterior neck pain x 3 days; was having fevers and chills, but resolved last night.  Pt's family members all had strep throat recently.  Has been taking Tylenol and Alka Seltzer.

## 2011-08-20 NOTE — ED Provider Notes (Signed)
History     CSN: [id number]  Arrival date & time 08/20/11  1628   First MD Initiated Contact with Patient 08/20/11 1638      Chief Complaint  Patient presents with  . Sore Throat    (Consider location/radiation/quality/duration/timing/severity/associated sxs/prior treatment) HPI Comments: SORE THROAT  Onset: 3 days    Severity: moderate Tried Tylenol without significant relief.  Symptoms:  States she felt feverish, but no documented temperature + Swollen neck glands    No Cough/URI sxs + Myalgias No Headache No Rash     Multiple family members currently with confirmed strep throat No Abdominal Pain No reflux sxs No Allergy sxs  No Breathing difficulty No Drooling No Trismus  ROS as noted in HPI. All other ROS negative.   Patient is a 36 y.o. female presenting with pharyngitis. The history is provided by the patient. No language interpreter was used.  Sore Throat This is a new problem.    Past Medical History  Diagnosis Date  . Hypertension   . Anxiety   . Depressed   . Strep pharyngitis   . Heart murmur   . Bronchitis     History reviewed. No pertinent past surgical history.  History reviewed. No pertinent family history.  History  Substance Use Topics  . Smoking status: Never Smoker   . Smokeless tobacco: Not on file  . Alcohol Use: No    OB History    Grav Para Term Preterm Abortions TAB SAB Ect Mult Living                  Review of Systems  Allergies  Review of patient's allergies indicates no known allergies.  Home Medications   Current Outpatient Rx  Name Route Sig Dispense Refill  . HYDROCHLOROTHIAZIDE 25 MG PO TABS Oral Take 25 mg by mouth daily.     Marland Kitchen LISINOPRIL 20 MG PO TABS Oral Take 20 mg by mouth daily.     . SERTRALINE HCL 100 MG PO TABS Oral Take 100 mg by mouth daily.      Marland Kitchen HYDROCODONE-ACETAMINOPHEN 5-325 MG PO TABS Oral Take 2 tablets by mouth every 4 (four) hours as needed for pain. 20 tablet 0  . IBUPROFEN 600 MG  PO TABS Oral Take 1 tablet (600 mg total) by mouth every 6 (six) hours as needed for pain. 30 tablet 0  . LIDOCAINE VISCOUS 2 % MT SOLN Oral Take 10 mLs by mouth 3 (three) times daily as needed for pain. Swish and spit. Do not swallow. 100 mL 0  . PENICILLIN V POTASSIUM 500 MG PO TABS Oral Take 1 tablet (500 mg total) by mouth 3 (three) times daily. X 10 days 30 tablet 0    BP 124/85  Pulse 85  Temp(Src) 98 F (36.7 C) (Oral)  Resp 18  SpO2 98%  LMP 08/09/2011  Physical Exam  Nursing note and vitals reviewed. Constitutional: She is oriented to person, place, and time. She appears well-developed and well-nourished. No distress.  HENT:  Head: Normocephalic and atraumatic. No trismus in the jaw.  Right Ear: Tympanic membrane normal.  Left Ear: Tympanic membrane normal.  Nose: Nose normal.  Mouth/Throat: Uvula is midline. No uvula swelling.       Erythematous oropharynx, petechiae on palate. Bilateral erythematous, enlarged tonsils with exudates.  Eyes: Conjunctivae and EOM are normal.  Neck: Normal range of motion.  Cardiovascular: Normal rate and normal heart sounds.   Pulmonary/Chest: Effort normal and breath sounds normal.  Abdominal:  She exhibits no distension.  Musculoskeletal: Normal range of motion.  Lymphadenopathy:    She has cervical adenopathy.  Neurological: She is alert and oriented to person, place, and time.  Skin: Skin is warm and dry.  Psychiatric: She has a normal mood and affect. Her behavior is normal. Judgment and thought content normal.    ED Course  Procedures (including critical care time)  Labs Reviewed  POCT RAPID STREP A ([facility] ONLY) - Abnormal; Notable for the following:    Streptococcus, Group A Screen (Direct) POSITIVE (*)    All other components within normal limits   No results found.   1. Strep throat       MDM  Home with 10 days of penicillin, viscous lidocaine, ibuprofen, Norco. Patient to followup with her primary care  physician PRN  Luiz Blare, MD 08/20/11 [phone]

## 2011-12-21 ENCOUNTER — Encounter (HOSPITAL_COMMUNITY): Payer: Self-pay

## 2011-12-21 ENCOUNTER — Emergency Department (HOSPITAL_COMMUNITY)
Admission: EM | Admit: 2011-12-21 | Discharge: 2011-12-21 | Disposition: A | Discharge status: Home / Self Care | Payer: Medicaid Other | Source: Home / Self Care | Attending: Emergency Medicine | Admitting: Emergency Medicine

## 2011-12-21 DIAGNOSIS — R109 Unspecified abdominal pain: Secondary | ICD-10-CM

## 2011-12-21 DIAGNOSIS — J309 Allergic rhinitis, unspecified: Secondary | ICD-10-CM

## 2011-12-21 DIAGNOSIS — R05 Cough: Secondary | ICD-10-CM

## 2011-12-21 DIAGNOSIS — R059 Cough, unspecified: Secondary | ICD-10-CM

## 2011-12-21 DIAGNOSIS — R141 Gas pain: Secondary | ICD-10-CM

## 2011-12-21 MED ORDER — ALBUTEROL SULFATE HFA 108 (90 BASE) MCG/ACT IN AERS: 1.0000 | INHALATION_SPRAY | Freq: Four times a day (QID) | RESPIRATORY_TRACT | Status: DC | PRN

## 2011-12-21 MED ORDER — LORATADINE 10 MG PO TABS: 10.0000 mg | ORAL_TABLET | Freq: Every day | ORAL | Status: DC

## 2011-12-21 NOTE — ED Notes (Signed)
C/o persistent productive cough of clear sputum for over 1 month.  States coughing makes her stomach cramp.  Diagnosed with bronchitis in March.

## 2011-12-21 NOTE — ED Provider Notes (Signed)
History     CSN: [id number]  Arrival date & time 12/21/11  1758   First MD Initiated Contact with Patient 12/21/11 1921      Chief Complaint  Patient presents with  . Cough    (Consider location/radiation/quality/duration/timing/severity/associated sxs/prior treatment) HPI Comments: Pt reports abd cramping on and off for 3 weeks, associated with constipation, gas and loose stools.  Pt reports she;s had lots of gas, and that passing gas helps relieve cramping.  Reports sometimes has constipation, sometimes loose stools ("but not watery like diarrhea") in last 3 weeks, and having bowel movements also helps relieve cramping.  Denies changes in diet except has been eating lots of yogurt; pt also reports having trouble digesting dairy.    Pt also c/o cough for a month, and nasal congestion/post nasal drip for 3 weeks.  Had bronchitis last winter, feels like has it again.  Albuterol inhaler really helped her when she had bronchitis, so she has been using her nephew's inhaler for some relief of sx.   Patient is a 36 y.o. female presenting with cramps and cough. The history is provided by the patient.  Abdominal Cramping The primary symptoms of the illness include diarrhea. The primary symptoms of the illness do not include fever, shortness of breath, nausea, vomiting or dysuria. Episode onset: 3 weeks ago. The onset of the illness was gradual. The problem has not changed since onset. Additional symptoms associated with the illness include constipation. Symptoms associated with the illness do not include chills.  Cough This is a new problem. Episode onset: one month ago. The problem occurs every few hours (worst at night). The cough is productive of sputum (sputum is clear). There has been no fever. Associated symptoms include rhinorrhea. Pertinent negatives include no chills, no ear pain, no sore throat, no shortness of breath and no wheezing. Treatments tried: albuterol. Improvement on treatment:  transient. Her past medical history is significant for bronchitis.    Past Medical History  Diagnosis Date  . Hypertension   . Anxiety   . Depressed   . Strep pharyngitis   . Heart murmur   . Bronchitis     History reviewed. No pertinent past surgical history.  History reviewed. No pertinent family history.  History  Substance Use Topics  . Smoking status: Never Smoker   . Smokeless tobacco: Not on file  . Alcohol Use: No    OB History    Grav Para Term Preterm Abortions TAB SAB Ect Mult Living                  Review of Systems  Constitutional: Negative for fever and chills.  HENT: Positive for congestion, rhinorrhea and postnasal drip. Negative for ear pain, sore throat and sinus pressure.   Respiratory: Positive for cough. Negative for shortness of breath and wheezing.   Gastrointestinal: Positive for diarrhea and constipation. Negative for nausea and vomiting.       Abd cramping  Genitourinary: Negative for dysuria and pelvic pain.    Allergies  Review of patient's allergies indicates no known allergies.  Home Medications   Current Outpatient Rx  Name Route Sig Dispense Refill  . HYDROCHLOROTHIAZIDE 25 MG PO TABS Oral Take 25 mg by mouth daily.     Marland Kitchen LISINOPRIL 20 MG PO TABS Oral Take 20 mg by mouth daily.     . SERTRALINE HCL 100 MG PO TABS Oral Take 100 mg by mouth daily.      . ALBUTEROL SULFATE HFA 108 (  90 BASE) MCG/ACT IN AERS Inhalation Inhale 1-2 puffs into the lungs every 6 (six) hours as needed for wheezing. 1 Inhaler 0  . LORATADINE 10 MG PO TABS Oral Take 1 tablet (10 mg total) by mouth daily. 30 tablet 0    BP 112/66  Pulse 92  Temp 98 F (36.7 C) (Oral)  Resp 18  SpO2 98%  LMP 12/13/2011  Physical Exam  Constitutional: She appears well-developed and well-nourished. No distress.  HENT:  Right Ear: Tympanic membrane, external ear and ear canal normal.  Left Ear: Tympanic membrane, external ear and ear canal normal.  Nose: Mucosal edema  and rhinorrhea present.  Mouth/Throat: Oropharynx is clear and moist.  Cardiovascular: Normal rate and regular rhythm.   Pulmonary/Chest: Effort normal and breath sounds normal.  Abdominal: Normal appearance and bowel sounds are normal. She exhibits distension. There is no tenderness. There is no rigidity, no rebound, no guarding and no CVA tenderness.       Mild distention of abd  Lymphadenopathy:       Head (right side): No submental, no submandibular, no tonsillar, no preauricular and no posterior auricular adenopathy present.       Head (left side): No submental, no submandibular, no tonsillar, no preauricular and no posterior auricular adenopathy present.    She has no cervical adenopathy.    ED Course  Procedures (including critical care time)  Labs Reviewed - No data to display No results found.   1. Allergic rhinitis   2. Cough   3. Abdominal cramping   4. Gas pain       MDM  Cough most likely related to post nasal drip, and this is most likely caused by seasonal allergies.  Will rx albuterol as pt feels like it helps.  Pt to talk with pcp about abd cramping (which is most likely diet related) and cough/allergies.         Cathlyn Parsons, NP 12/21/11 2043

## 2011-12-24 NOTE — ED Provider Notes (Signed)
Medical screening examination/treatment/procedure(s) were performed by non-physician practitioner and as supervising physician I was immediately available for consultation/collaboration.  Leslee Home, M.D.   Reuben Likes, MD 12/24/11 1540

## 2012-01-15 ENCOUNTER — Encounter: Payer: Medicaid Other | Admitting: Obstetrics and Gynecology

## 2012-01-22 ENCOUNTER — Encounter: Payer: Medicaid Other | Admitting: Obstetrics and Gynecology

## 2012-02-01 ENCOUNTER — Encounter: Payer: Medicaid Other | Admitting: Obstetrics and Gynecology

## 2012-02-11 ENCOUNTER — Encounter: Payer: Medicaid Other | Admitting: Obstetrics and Gynecology

## 2012-02-11 ENCOUNTER — Emergency Department (HOSPITAL_COMMUNITY)
Admission: EM | Admit: 2012-02-11 | Discharge: 2012-02-12 | Disposition: A | Discharge status: Home / Self Care | Payer: Medicaid Other | Attending: Emergency Medicine | Admitting: Emergency Medicine

## 2012-02-11 ENCOUNTER — Encounter (HOSPITAL_COMMUNITY): Payer: Self-pay | Admitting: Emergency Medicine

## 2012-02-11 DIAGNOSIS — F3289 Other specified depressive episodes: Secondary | ICD-10-CM | POA: Insufficient documentation

## 2012-02-11 DIAGNOSIS — I10 Essential (primary) hypertension: Secondary | ICD-10-CM | POA: Insufficient documentation

## 2012-02-11 DIAGNOSIS — N72 Inflammatory disease of cervix uteri: Secondary | ICD-10-CM | POA: Insufficient documentation

## 2012-02-11 DIAGNOSIS — F411 Generalized anxiety disorder: Secondary | ICD-10-CM | POA: Insufficient documentation

## 2012-02-11 DIAGNOSIS — N760 Acute vaginitis: Secondary | ICD-10-CM | POA: Insufficient documentation

## 2012-02-11 DIAGNOSIS — A499 Bacterial infection, unspecified: Secondary | ICD-10-CM | POA: Insufficient documentation

## 2012-02-11 DIAGNOSIS — F329 Major depressive disorder, single episode, unspecified: Secondary | ICD-10-CM | POA: Insufficient documentation

## 2012-02-11 DIAGNOSIS — B9689 Other specified bacterial agents as the cause of diseases classified elsewhere: Secondary | ICD-10-CM | POA: Insufficient documentation

## 2012-02-11 LAB — URINALYSIS, ROUTINE W REFLEX MICROSCOPIC
Bilirubin Urine: NEGATIVE
Glucose, UA: NEGATIVE mg/dL
Hgb urine dipstick: NEGATIVE
Ketones, ur: NEGATIVE mg/dL
Nitrite: NEGATIVE
Protein, ur: NEGATIVE mg/dL
Specific Gravity, Urine: 1.02 (ref 1.005–1.030)
Urobilinogen, UA: 0.2 mg/dL (ref 0.0–1.0)
pH: 5.5 (ref 5.0–8.0)

## 2012-02-11 LAB — URINE MICROSCOPIC-ADD ON

## 2012-02-11 LAB — PREGNANCY, URINE: Preg Test, Ur: NEGATIVE

## 2012-02-11 LAB — WET PREP, GENITAL
Clue Cells Wet Prep HPF POC: NONE SEEN
Trich, Wet Prep: NONE SEEN
Yeast Wet Prep HPF POC: NONE SEEN

## 2012-02-11 MED ORDER — METRONIDAZOLE 500 MG PO TABS: 500.0000 mg | ORAL_TABLET | Freq: Two times a day (BID) | ORAL | Status: DC

## 2012-02-11 MED ORDER — METRONIDAZOLE 500 MG PO TABS
500.0000 mg | ORAL_TABLET | Freq: Once | ORAL | Status: AC
  Administered 2012-02-11: 500 mg via ORAL
  Filled 2012-02-11: qty 1

## 2012-02-11 MED ORDER — AZITHROMYCIN 250 MG PO TABS
1000.0000 mg | ORAL_TABLET | Freq: Once | ORAL | Status: AC
  Administered 2012-02-11: 1000 mg via ORAL
  Filled 2012-02-11: qty 4

## 2012-02-11 MED ORDER — LIDOCAINE HCL 1 % IJ SOLN
0.9000 mL | Freq: Once | INTRAMUSCULAR | Status: AC
  Administered 2012-02-11: 20 mL
  Filled 2012-02-11: qty 20

## 2012-02-11 MED ORDER — CEFTRIAXONE SODIUM 250 MG IJ SOLR
250.0000 mg | Freq: Once | INTRAMUSCULAR | Status: AC
  Administered 2012-02-11: 250 mg via INTRAMUSCULAR
  Filled 2012-02-11: qty 250

## 2012-02-11 NOTE — ED Notes (Signed)
Pt c/o of lower abdominal cramping.  States that she had unprotected sex last month and is wanting to make sure she is "clean".

## 2012-02-11 NOTE — ED Notes (Signed)
Pt denies any pain with urination or N/V.

## 2012-02-11 NOTE — ED Provider Notes (Signed)
History     CSN: [id number]  Arrival date & time 02/11/12  1612   First MD Initiated Contact with Patient 02/11/12 2019      Chief Complaint  Patient presents with  . Abdominal Cramping    (Consider location/radiation/quality/duration/timing/severity/associated sxs/prior treatment) HPI Comments: Patient states she had a short-term relationship with a man, who she had unprotected intercourse with him.  Now is concerned she may have contracted an STD.  She has no symptoms of vaginal discharge, vaginal pain, abdominal pain, nausea, vomiting, dysuria.  Patient is a 36 y.o. female presenting with cramps. The history is provided by the patient.  Abdominal Cramping The primary symptoms of the illness do not include abdominal pain, fever, fatigue, shortness of breath, nausea, vomiting, diarrhea, dysuria, vaginal discharge or vaginal bleeding.    Past Medical History  Diagnosis Date  . Hypertension   . Anxiety   . Depressed   . Strep pharyngitis   . Heart murmur   . Bronchitis     History reviewed. No pertinent past surgical history.  No family history on file.  History  Substance Use Topics  . Smoking status: Never Smoker   . Smokeless tobacco: Not on file  . Alcohol Use: No    OB History    Grav Para Term Preterm Abortions TAB SAB Ect Mult Living                  Review of Systems  Constitutional: Negative for fever and fatigue.  Respiratory: Negative for shortness of breath.   Gastrointestinal: Negative for nausea, vomiting, abdominal pain and diarrhea.  Genitourinary: Negative for dysuria, vaginal bleeding and vaginal discharge.  Skin: Negative for rash and wound.  Neurological: Negative for weakness.    Allergies  Review of patient's allergies indicates no known allergies.  Home Medications   Current Outpatient Rx  Name Route Sig Dispense Refill  . ALBUTEROL SULFATE HFA 108 (90 BASE) MCG/ACT IN AERS Inhalation Inhale 1-2 puffs into the lungs every 6 (six)  hours as needed for wheezing. 1 Inhaler 0  . HYDROCHLOROTHIAZIDE 25 MG PO TABS Oral Take 25 mg by mouth daily.     Marland Kitchen LISINOPRIL 20 MG PO TABS Oral Take 20 mg by mouth daily.     Marland Kitchen LORATADINE 10 MG PO TABS Oral Take 1 tablet (10 mg total) by mouth daily. 30 tablet 0  . SERTRALINE HCL 100 MG PO TABS Oral Take 100 mg by mouth daily.      Marland Kitchen METRONIDAZOLE 500 MG PO TABS Oral Take 1 tablet (500 mg total) by mouth 2 (two) times daily. 13 tablet 0    BP 136/87  Pulse 71  Temp 98.9 F (37.2 C) (Oral)  Resp 22  SpO2 100%  LMP 02/05/2012  Physical Exam  Constitutional: She is oriented to person, place, and time. She appears well-developed and well-nourished.  HENT:  Head: Normocephalic.  Eyes: Pupils are equal, round, and reactive to light.  Neck: Normal range of motion.  Cardiovascular: Normal rate.   Abdominal: Soft. Bowel sounds are normal. She exhibits no distension. There is no tenderness.  Genitourinary: Cervix exhibits discharge. Right adnexum displays no tenderness. Left adnexum displays no tenderness. No erythema or tenderness around the vagina. Vaginal discharge found.       + odor  Musculoskeletal: Normal range of motion.  Neurological: She is alert and oriented to person, place, and time.  Skin: Skin is warm.    ED Course  Procedures (including critical care time)  Labs Reviewed  URINALYSIS, ROUTINE W REFLEX MICROSCOPIC - Abnormal; Notable for the following:    APPearance CLOUDY (*)     Leukocytes, UA MODERATE (*)     All other components within normal limits  URINE MICROSCOPIC-ADD ON - Abnormal; Notable for the following:    Squamous Epithelial / LPF FEW (*)     Bacteria, UA FEW (*)     All other components within normal limits  WET PREP, GENITAL - Abnormal; Notable for the following:    WBC, Wet Prep HPF POC TOO NUMEROUS TO COUNT (*)     All other components within normal limits  PREGNANCY, URINE  GC/CHLAMYDIA PROBE AMP, GENITAL   No results found.   1.  Bacterial vaginitis   2. Cervicitis       MDM  Will treat with IM Rocephin and Azithromycin as well as Flagy for 7 days         Arman Filter, NP 02/11/12 2314  Arman Filter, NP 02/11/12 2315

## 2012-02-12 ENCOUNTER — Encounter: Payer: Medicaid Other | Admitting: Obstetrics and Gynecology

## 2012-02-12 LAB — GC/CHLAMYDIA PROBE AMP, GENITAL
Chlamydia, DNA Probe: NEGATIVE
GC Probe Amp, Genital: NEGATIVE

## 2012-02-12 NOTE — ED Provider Notes (Signed)
Medical screening examination/treatment/procedure(s) were performed by non-physician practitioner and as supervising physician I was immediately available for consultation/collaboration.   Gerhard Munch, MD 02/12/12 [phone]

## 2012-04-03 ENCOUNTER — Encounter (HOSPITAL_COMMUNITY): Payer: Self-pay | Admitting: Physical Medicine and Rehabilitation

## 2012-04-03 ENCOUNTER — Emergency Department (HOSPITAL_COMMUNITY)
Admission: EM | Admit: 2012-04-03 | Discharge: 2012-04-03 | Disposition: A | Discharge status: Home / Self Care | Payer: Medicaid Other | Source: Home / Self Care | Attending: Family Medicine | Admitting: Family Medicine

## 2012-04-03 ENCOUNTER — Emergency Department (HOSPITAL_COMMUNITY)
Admission: EM | Admit: 2012-04-03 | Discharge: 2012-04-03 | Disposition: A | Discharge status: Home / Self Care | Payer: Medicaid Other | Attending: Emergency Medicine | Admitting: Emergency Medicine

## 2012-04-03 DIAGNOSIS — F329 Major depressive disorder, single episode, unspecified: Secondary | ICD-10-CM | POA: Insufficient documentation

## 2012-04-03 DIAGNOSIS — B356 Tinea cruris: Secondary | ICD-10-CM | POA: Insufficient documentation

## 2012-04-03 DIAGNOSIS — R21 Rash and other nonspecific skin eruption: Secondary | ICD-10-CM | POA: Insufficient documentation

## 2012-04-03 DIAGNOSIS — N898 Other specified noninflammatory disorders of vagina: Secondary | ICD-10-CM | POA: Insufficient documentation

## 2012-04-03 DIAGNOSIS — F411 Generalized anxiety disorder: Secondary | ICD-10-CM | POA: Insufficient documentation

## 2012-04-03 DIAGNOSIS — Z8679 Personal history of other diseases of the circulatory system: Secondary | ICD-10-CM | POA: Insufficient documentation

## 2012-04-03 DIAGNOSIS — Z79899 Other long term (current) drug therapy: Secondary | ICD-10-CM | POA: Insufficient documentation

## 2012-04-03 DIAGNOSIS — F3289 Other specified depressive episodes: Secondary | ICD-10-CM | POA: Insufficient documentation

## 2012-04-03 DIAGNOSIS — L299 Pruritus, unspecified: Secondary | ICD-10-CM | POA: Insufficient documentation

## 2012-04-03 DIAGNOSIS — Z8709 Personal history of other diseases of the respiratory system: Secondary | ICD-10-CM | POA: Insufficient documentation

## 2012-04-03 DIAGNOSIS — I10 Essential (primary) hypertension: Secondary | ICD-10-CM | POA: Insufficient documentation

## 2012-04-03 DIAGNOSIS — Z760 Encounter for issue of repeat prescription: Secondary | ICD-10-CM | POA: Insufficient documentation

## 2012-04-03 DIAGNOSIS — N72 Inflammatory disease of cervix uteri: Secondary | ICD-10-CM | POA: Insufficient documentation

## 2012-04-03 LAB — URINE MICROSCOPIC-ADD ON

## 2012-04-03 LAB — URINALYSIS, ROUTINE W REFLEX MICROSCOPIC
Bilirubin Urine: NEGATIVE
Glucose, UA: NEGATIVE mg/dL
Hgb urine dipstick: NEGATIVE
Ketones, ur: NEGATIVE mg/dL
Nitrite: NEGATIVE
Protein, ur: NEGATIVE mg/dL
Specific Gravity, Urine: 1.02 (ref 1.005–1.030)
Urobilinogen, UA: 0.2 mg/dL (ref 0.0–1.0)
pH: 6 (ref 5.0–8.0)

## 2012-04-03 LAB — WET PREP, GENITAL
Trich, Wet Prep: NONE SEEN
Yeast Wet Prep HPF POC: NONE SEEN

## 2012-04-03 LAB — POCT PREGNANCY, URINE: Preg Test, Ur: NEGATIVE

## 2012-04-03 MED ORDER — LIDOCAINE HCL (PF) 1 % IJ SOLN
INTRAMUSCULAR | Status: AC
  Filled 2012-04-03: qty 5

## 2012-04-03 MED ORDER — LISINOPRIL 10 MG PO TABS: 10.0000 mg | ORAL_TABLET | Freq: Every day | ORAL | Status: DC

## 2012-04-03 MED ORDER — LIDOCAINE HCL (PF) 1 % IJ SOLN
1.5000 mL | Freq: Once | INTRAMUSCULAR | Status: AC
  Administered 2012-04-03: 1.5 mL

## 2012-04-03 MED ORDER — CEFTRIAXONE SODIUM 250 MG IJ SOLR
250.0000 mg | Freq: Once | INTRAMUSCULAR | Status: AC
  Administered 2012-04-03: 250 mg via INTRAMUSCULAR
  Filled 2012-04-03: qty 250

## 2012-04-03 MED ORDER — AZITHROMYCIN 250 MG PO TABS
1000.0000 mg | ORAL_TABLET | Freq: Once | ORAL | Status: AC
  Administered 2012-04-03: 1000 mg via ORAL
  Filled 2012-04-03: qty 4

## 2012-04-03 MED ORDER — TRIAMCINOLONE ACETONIDE 0.1 % EX CREA: TOPICAL_CREAM | Freq: Two times a day (BID) | CUTANEOUS | Status: DC

## 2012-04-03 MED ORDER — HYDROCHLOROTHIAZIDE 12.5 MG PO TABS: 12.5000 mg | ORAL_TABLET | Freq: Every day | ORAL | Status: DC

## 2012-04-03 NOTE — ED Notes (Signed)
Pt presents to department for evaluation of medication refill, states she is out of her BP medication. Also states vaginal itching and rash to bilateral inner thighs. Ongoing for several days. Denies vaginal bleeding/discharge. Denies urinary symptoms. She is alert and oriented x4. No signs of distress noted.

## 2012-04-03 NOTE — ED Provider Notes (Signed)
History     CSN: [id number]  Arrival date & time 04/03/12  6295   First MD Initiated Contact with Patient 04/03/12 [phone]      Chief Complaint  Patient presents with  . Vaginal Itching  . Medication Refill    (Consider location/radiation/quality/duration/timing/severity/associated sxs/prior treatment) HPI Comments: Patient presents for medication refill and vaginal irritation. Patient states that her vulva has been itching and burning X 7 days. She states that she has had unprotected sexual intercourse 3-4 weeks ago and her partner told her he had tested positive for chlamydia and been treated. Patient states that has had chlamydia in the past. Patient also complains of itchy rash on inner thighs that improves with aveeno oatmeal lotion. Denies fever or chills. Denies NVD or abdominal pain. Denies vaginal sores, vaginal discharge, or dyspareunia. Denies dysuria, urgency, frequency.   The history is provided by the patient. No language interpreter was used.    Past Medical History  Diagnosis Date  . Hypertension   . Anxiety   . Depressed   . Strep pharyngitis   . Heart murmur   . Bronchitis     No past surgical history on file.  No family history on file.  History  Substance Use Topics  . Smoking status: Never Smoker   . Smokeless tobacco: Not on file  . Alcohol Use: No    OB History    Grav Para Term Preterm Abortions TAB SAB Ect Mult Living                  Review of Systems  Constitutional: Negative for fever and chills.  Gastrointestinal: Negative for nausea, vomiting, abdominal pain and diarrhea.  Genitourinary: Positive for vaginal discharge. Negative for dysuria, urgency, frequency and vaginal bleeding.  Skin: Positive for rash.    Allergies  Review of patient's allergies indicates no known allergies.  Home Medications   Current Outpatient Rx  Name  Route  Sig  Dispense  Refill  . ALBUTEROL SULFATE HFA 108 (90 BASE) MCG/ACT IN AERS   Inhalation  Inhale 1-2 puffs into the lungs every 6 (six) hours as needed for wheezing.   1 Inhaler   0   . SERTRALINE HCL 100 MG PO TABS   Oral   Take 100 mg by mouth daily.             BP 118/76  Pulse 63  Temp 98.3 F (36.8 C) (Oral)  Resp 16  SpO2 100%  Physical Exam  Nursing note and vitals reviewed. Constitutional: She appears well-developed and well-nourished.  HENT:  Head: Normocephalic and atraumatic.  Mouth/Throat: Oropharynx is clear and moist.  Eyes: Conjunctivae normal and EOM are normal. No scleral icterus.  Neck: Normal range of motion. Neck supple.  Cardiovascular: Normal rate, regular rhythm and normal heart sounds.   Pulmonary/Chest: Effort normal and breath sounds normal.  Abdominal: Soft. Bowel sounds are normal. There is no tenderness.  Genitourinary:    Right adnexum displays no tenderness. Left adnexum displays no tenderness.    Neurological: She is alert.  Skin: Skin is warm and dry.    ED Course  Procedures (including critical care time)   Labs Reviewed  WET PREP, GENITAL  GC/CHLAMYDIA PROBE AMP  URINALYSIS, ROUTINE W REFLEX MICROSCOPIC   Results for orders placed during the hospital encounter of 04/03/12  WET PREP, GENITAL      Component Value Range   Yeast Wet Prep HPF POC NONE SEEN  NONE SEEN   Trich, Wet  Prep NONE SEEN  NONE SEEN   Clue Cells Wet Prep HPF POC FEW (*) NONE SEEN   WBC, Wet Prep HPF POC MANY (*) NONE SEEN  URINALYSIS, ROUTINE W REFLEX MICROSCOPIC      Component Value Range   Color, Urine YELLOW  YELLOW   APPearance CLEAR  CLEAR   Specific Gravity, Urine 1.020  1.005 - 1.030   pH 6.0  5.0 - 8.0   Glucose, UA NEGATIVE  NEGATIVE mg/dL   Hgb urine dipstick NEGATIVE  NEGATIVE   Bilirubin Urine NEGATIVE  NEGATIVE   Ketones, ur NEGATIVE  NEGATIVE mg/dL   Protein, ur NEGATIVE  NEGATIVE mg/dL   Urobilinogen, UA 0.2  0.0 - 1.0 mg/dL   Nitrite NEGATIVE  NEGATIVE   Leukocytes, UA SMALL (*) NEGATIVE  POCT PREGNANCY, URINE       Component Value Range   Preg Test, Ur NEGATIVE  NEGATIVE  URINE MICROSCOPIC-ADD ON      Component Value Range   Squamous Epithelial / LPF RARE  RARE   WBC, UA 3-6  <3 WBC/hpf   RBC / HPF 0-2  <3 RBC/hpf   Bacteria, UA RARE  RARE    No results found.   1. Medication refill   2. Cervicitis   3. Tinea cruris       MDM  Patient presented with request for refill for antihypertensive medication, STD screening, and rash. Medication refilled. Patient treated in ED for chlamydia/gonorrhea due to exam findings and exposure. Rash consistent with tinea cruris. Patient discharged with Rx for triamcinolone. Also given referral to establish primary care. Return precautions given. No red flags for tuboovarian abscess.         Pixie Casino, PA-C 04/03/12 1228

## 2012-04-03 NOTE — ED Notes (Signed)
Pt states vaginal itching, and rash to bilateral inner thighs. Ongoing for several days. Denies vaginal discharge. Denies urinary symptoms. States "I could of been exposed to STD, but I'm not sure." no signs of distress noted.

## 2012-04-04 LAB — GC/CHLAMYDIA PROBE AMP
CT Probe RNA: NEGATIVE
GC Probe RNA: NEGATIVE

## 2012-04-04 NOTE — ED Provider Notes (Signed)
Medical screening examination/treatment/procedure(s) were performed by non-physician practitioner and as supervising physician I was immediately available for consultation/collaboration.  Tobin Chad, MD 04/04/12 [phone]

## 2012-05-20 ENCOUNTER — Encounter (HOSPITAL_COMMUNITY): Payer: Self-pay | Admitting: *Deleted

## 2012-05-20 ENCOUNTER — Emergency Department (HOSPITAL_COMMUNITY)
Admission: EM | Admit: 2012-05-20 | Discharge: 2012-05-20 | Disposition: A | Discharge status: Home / Self Care | Payer: Medicaid Other | Attending: Emergency Medicine | Admitting: Emergency Medicine

## 2012-05-20 DIAGNOSIS — Z79899 Other long term (current) drug therapy: Secondary | ICD-10-CM | POA: Insufficient documentation

## 2012-05-20 DIAGNOSIS — Y929 Unspecified place or not applicable: Secondary | ICD-10-CM | POA: Insufficient documentation

## 2012-05-20 DIAGNOSIS — I10 Essential (primary) hypertension: Secondary | ICD-10-CM | POA: Insufficient documentation

## 2012-05-20 DIAGNOSIS — S339XXA Sprain of unspecified parts of lumbar spine and pelvis, initial encounter: Secondary | ICD-10-CM | POA: Insufficient documentation

## 2012-05-20 DIAGNOSIS — S39012A Strain of muscle, fascia and tendon of lower back, initial encounter: Secondary | ICD-10-CM

## 2012-05-20 DIAGNOSIS — Z8709 Personal history of other diseases of the respiratory system: Secondary | ICD-10-CM | POA: Insufficient documentation

## 2012-05-20 DIAGNOSIS — Y9389 Activity, other specified: Secondary | ICD-10-CM | POA: Insufficient documentation

## 2012-05-20 DIAGNOSIS — R011 Cardiac murmur, unspecified: Secondary | ICD-10-CM | POA: Insufficient documentation

## 2012-05-20 DIAGNOSIS — X500XXA Overexertion from strenuous movement or load, initial encounter: Secondary | ICD-10-CM | POA: Insufficient documentation

## 2012-05-20 DIAGNOSIS — F341 Dysthymic disorder: Secondary | ICD-10-CM | POA: Insufficient documentation

## 2012-05-20 MED ORDER — IBUPROFEN 200 MG PO TABS
600.0000 mg | ORAL_TABLET | Freq: Once | ORAL | Status: AC
  Administered 2012-05-20: 600 mg via ORAL
  Filled 2012-05-20: qty 3

## 2012-05-20 MED ORDER — NAPROXEN 500 MG PO TABS: 500.0000 mg | ORAL_TABLET | Freq: Two times a day (BID) | ORAL | Status: DC | PRN

## 2012-05-20 MED ORDER — OXYCODONE-ACETAMINOPHEN 5-325 MG PO TABS
2.0000 | ORAL_TABLET | Freq: Once | ORAL | Status: AC
  Administered 2012-05-20: 2 via ORAL
  Filled 2012-05-20: qty 2

## 2012-05-20 NOTE — ED Notes (Signed)
Pt states was lifting heavy buckets of water on new years day and hurt lower back; continued pain since then; denies numbness or tingling

## 2012-05-24 NOTE — ED Provider Notes (Signed)
History    37 year old female with lower back pain. Onset was leaving in years day. She reports that early initiated. Will the pockets of water. Gradual onset of pain which has persistently worsened. Mild ache at rest worse with movement. Has not tried taking anything for her pain or any other interventions. Denies any numbness, tingling or loss of strength. No urinary complaints. No fevers or chills. No history of back surgery. No use of blood thinning medication. Denies IV drug use.  CSN: [id number]  Arrival date & time 05/20/12  2215   First MD Initiated Contact with Patient 05/20/12 2242      No chief complaint on file.   (Consider location/radiation/quality/duration/timing/severity/associated sxs/prior treatment) HPI  Past Medical History  Diagnosis Date  . Hypertension   . Anxiety   . Depressed   . Strep pharyngitis   . Heart murmur   . Bronchitis     History reviewed. No pertinent past surgical history.  No family history on file.  History  Substance Use Topics  . Smoking status: Never Smoker   . Smokeless tobacco: Not on file  . Alcohol Use: No    OB History    Grav Para Term Preterm Abortions TAB SAB Ect Mult Living                  Review of Systems  All systems reviewed and negative, other than as noted in HPI.   Allergies  Review of patient's allergies indicates no known allergies.  Home Medications   Current Outpatient Rx  Name  Route  Sig  Dispense  Refill  . ALBUTEROL SULFATE HFA 108 (90 BASE) MCG/ACT IN AERS   Inhalation   Inhale 1-2 puffs into the lungs every 6 (six) hours as needed for wheezing.   1 Inhaler   0   . HYDROCHLOROTHIAZIDE 12.5 MG PO TABS   Oral   Take 1 tablet (12.5 mg total) by mouth daily.   30 tablet   1   . LISINOPRIL 10 MG PO TABS   Oral   Take 1 tablet (10 mg total) by mouth daily.   30 tablet   1   . LORATADINE 10 MG PO TABS   Oral   Take 10 mg by mouth daily.         . SERTRALINE HCL 100 MG PO TABS  Oral   Take 100 mg by mouth daily.           Marland Kitchen NAPROXEN 500 MG PO TABS   Oral   Take 1 tablet (500 mg total) by mouth 2 (two) times daily as needed.   20 tablet   0     BP 147/83  Pulse 79  Temp 98.6 F (37 C) (Oral)  Resp 19  SpO2 100%  LMP 05/16/2012  Physical Exam  Nursing note and vitals reviewed. Constitutional: She appears well-developed and well-nourished. No distress.  HENT:  Head: Normocephalic and atraumatic.  Eyes: Conjunctivae normal are normal. Right eye exhibits no discharge. Left eye exhibits no discharge.  Neck: Neck supple.  Cardiovascular: Normal rate, regular rhythm and normal heart sounds.  Exam reveals no gallop and no friction rub.   No murmur heard. Pulmonary/Chest: Effort normal and breath sounds normal. No respiratory distress.  Abdominal: Soft. She exhibits no distension. There is no tenderness.  Musculoskeletal: She exhibits no edema and no tenderness.       Mild tenderness across lower back. Paraspinally and in the midline in the lumbar region. No  concerning skin lesions. No step-off or deformity. Crepitus. She is 5 out of 5 bilateral lower extremities. Patellar reflexes are one plus bilaterally. Sensation is intact to light touch. Gait is steady.  Neurological: She is alert.  Skin: Skin is warm and dry.  Psychiatric: She has a normal mood and affect. Her behavior is normal. Thought content normal.    ED Course  Procedures (including critical care time)  Labs Reviewed - No data to display No results found.   1. Lumbosacral strain       MDM  37 year old female with atraumatic lower back pain. Suspect lumbosacral strain. Patient has a nonfocal neurological examination. There are no "red flags.". No indication for imaging. Plan symptomatic treatment. Emergent return precautions were discussed.        Raeford Razor, MD 05/24/12 [phone]

## 2012-07-22 ENCOUNTER — Inpatient Hospital Stay (HOSPITAL_COMMUNITY)
Admission: AD | Admit: 2012-07-22 | Discharge: 2012-07-22 | Disposition: A | Discharge status: Home / Self Care | Payer: Medicaid Other | Source: Ambulatory Visit | Attending: Obstetrics | Admitting: Obstetrics

## 2012-07-22 ENCOUNTER — Encounter (HOSPITAL_COMMUNITY): Payer: Self-pay | Admitting: Obstetrics and Gynecology

## 2012-07-22 DIAGNOSIS — B3731 Acute candidiasis of vulva and vagina: Secondary | ICD-10-CM | POA: Insufficient documentation

## 2012-07-22 DIAGNOSIS — L293 Anogenital pruritus, unspecified: Secondary | ICD-10-CM | POA: Insufficient documentation

## 2012-07-22 DIAGNOSIS — N760 Acute vaginitis: Secondary | ICD-10-CM

## 2012-07-22 DIAGNOSIS — B373 Candidiasis of vulva and vagina: Secondary | ICD-10-CM | POA: Insufficient documentation

## 2012-07-22 LAB — WET PREP, GENITAL
Clue Cells Wet Prep HPF POC: NONE SEEN
Trich, Wet Prep: NONE SEEN
Yeast Wet Prep HPF POC: NONE SEEN

## 2012-07-22 LAB — URINALYSIS, ROUTINE W REFLEX MICROSCOPIC
Bilirubin Urine: NEGATIVE
Glucose, UA: NEGATIVE mg/dL
Ketones, ur: 15 mg/dL — AB
Nitrite: NEGATIVE
Protein, ur: NEGATIVE mg/dL
Specific Gravity, Urine: 1.01 (ref 1.005–1.030)
Urobilinogen, UA: 0.2 mg/dL (ref 0.0–1.0)
pH: 6 (ref 5.0–8.0)

## 2012-07-22 LAB — URINE MICROSCOPIC-ADD ON

## 2012-07-22 LAB — POCT PREGNANCY, URINE: Preg Test, Ur: NEGATIVE

## 2012-07-22 MED ORDER — TERCONAZOLE 0.4 % VA CREA: 1.0000 | TOPICAL_CREAM | Freq: Every day | VAGINAL | Status: DC

## 2012-07-22 NOTE — MAU Note (Signed)
Name and DOB verified, pt confirms spelling on armband is correct.

## 2012-07-22 NOTE — MAU Provider Note (Signed)
History     CSN: [id number]  Arrival date and time: 07/22/12 1428   First Brynlei Klausner Initiated Contact with Patient 07/22/12 1805      Chief Complaint  Patient presents with  . Vaginal Discharge  . Abdominal Pain   HPI This is a 37 y.o. female who presents with report of vaginal itching for several days. Had an appointment today with Dr Clearance Coots but slept through it.  Has been discharged from several practices for missing appointments. Denies any other pain  Had some pins and needles sensation in lower abdomen yesterday but none today. Normal bleeding patterns. NO other medical problems OB History   Grav Para Term Preterm Abortions TAB SAB Ect Mult Living   2 0   2 1 1    0      Past Medical History  Diagnosis Date  . Hypertension   . Anxiety   . Depressed   . Strep pharyngitis   . Heart murmur   . Bronchitis     History reviewed. No pertinent past surgical history.  History reviewed. No pertinent family history.  History  Substance Use Topics  . Smoking status: Never Smoker   . Smokeless tobacco: Not on file  . Alcohol Use: No    Allergies: No Known Allergies  Prescriptions prior to admission  Medication Sig Dispense Refill  . lisinopril (PRINIVIL,ZESTRIL) 10 MG tablet Take 1 tablet (10 mg total) by mouth daily.  30 tablet  1  . Prenatal Vit-Fe Fumarate-FA (PRENATAL MULTIVITAMIN) TABS Take 1 tablet by mouth daily at 12 noon.      . sertraline (ZOLOFT) 100 MG tablet Take 100 mg by mouth daily. depression ans anxiety      . [DISCONTINUED] albuterol (PROVENTIL HFA;VENTOLIN HFA) 108 (90 BASE) MCG/ACT inhaler Inhale 1-2 puffs into the lungs every 6 (six) hours as needed for wheezing.  1 Inhaler  0  . [DISCONTINUED] hydrochlorothiazide (HYDRODIURIL) 12.5 MG tablet Take 1 tablet (12.5 mg total) by mouth daily.  30 tablet  1  . [DISCONTINUED] loratadine (CLARITIN) 10 MG tablet Take 10 mg by mouth daily.      . [DISCONTINUED] naproxen (NAPROSYN) 500 MG tablet Take 1 tablet  (500 mg total) by mouth 2 (two) times daily as needed.  20 tablet  0    Review of Systems  Constitutional: Negative for fever, chills and malaise/fatigue.  Gastrointestinal: Negative for nausea, vomiting and abdominal pain.  Genitourinary: Negative for dysuria.       Vaginal itching white discharge  Musculoskeletal: Negative for myalgias.  Skin: Positive for itching.  Neurological: Negative for dizziness and headaches.   Physical Exam   Blood pressure 111/74, pulse 70, temperature 98.7 F (37.1 C), temperature source Oral, resp. rate 20, height 5' 3.5" (1.613 m), weight 200 lb (90.719 kg), last menstrual period 07/02/2012.  Physical Exam  Constitutional: She is oriented to person, place, and time. She appears well-developed and well-nourished. No distress.  HENT:  Head: Normocephalic.  Cardiovascular: Normal rate.   Respiratory: Effort normal.  GI: Soft. There is no tenderness.  Genitourinary: Uterus normal. Vaginal discharge found.  Moderate erethema to mons, vulva and vagina with clumpy white discharge.  Cervix friable  Musculoskeletal: Normal range of motion.  Neurological: She is alert and oriented to person, place, and time.  Skin: Skin is warm and dry. She is not diaphoretic.  Psychiatric: She has a normal mood and affect.    MAU Course  Procedures  MDM Results for orders placed during the hospital encounter  of 07/22/12 (from the past 72 hour(s))  URINALYSIS, ROUTINE W REFLEX MICROSCOPIC     Status: Abnormal   Collection Time    07/22/12  5:26 PM      Result Value Range   Color, Urine YELLOW  YELLOW   APPearance HAZY (*) CLEAR   Specific Gravity, Urine 1.010  1.005 - 1.030   pH 6.0  5.0 - 8.0   Glucose, UA NEGATIVE  NEGATIVE mg/dL   Hgb urine dipstick TRACE (*) NEGATIVE   Bilirubin Urine NEGATIVE  NEGATIVE   Ketones, ur 15 (*) NEGATIVE mg/dL   Protein, ur NEGATIVE  NEGATIVE mg/dL   Urobilinogen, UA 0.2  0.0 - 1.0 mg/dL   Nitrite NEGATIVE  NEGATIVE    Leukocytes, UA MODERATE (*) NEGATIVE  URINE MICROSCOPIC-ADD ON     Status: Abnormal   Collection Time    07/22/12  5:26 PM      Result Value Range   Squamous Epithelial / LPF FEW (*) RARE   WBC, UA 11-20  <3 WBC/hpf   Bacteria, UA FEW (*) RARE  POCT PREGNANCY, URINE     Status: None   Collection Time    07/22/12  5:32 PM      Result Value Range   Preg Test, Ur NEGATIVE  NEGATIVE   Comment:            THE SENSITIVITY OF THIS     METHODOLOGY IS >24 mIU/mL  WET PREP, GENITAL     Status: Abnormal   Collection Time    07/22/12  6:10 PM      Result Value Range   Yeast Wet Prep HPF POC NONE SEEN  NONE SEEN   Trich, Wet Prep NONE SEEN  NONE SEEN   Clue Cells Wet Prep HPF POC NONE SEEN  NONE SEEN   WBC, Wet Prep HPF POC MODERATE (*) NONE SEEN   Comment: MODERATE BACTERIA SEEN     Assessment and Plan  A:  Yeast vaginitis, clinically, despite negative wet prep      P:  Rx terazol 7. Pt informed if it is yeast, should improve in 2 days      Encouraged her to reschedule appt with Dr Romana Juniper 07/22/2012, 7:35 PM

## 2012-07-22 NOTE — MAU Note (Signed)
Started 2 wks ago,vag irritation, soap seemed to irritate, "up in there'.    Noted a white coating in the front. Pins and needles in lower abd- noted yesterday- none today.

## 2012-07-22 NOTE — MAU Note (Signed)
Not in lobby

## 2012-07-23 LAB — GC/CHLAMYDIA PROBE AMP
CT Probe RNA: NEGATIVE
GC Probe RNA: NEGATIVE

## 2012-07-24 LAB — URINE CULTURE: Colony Count: 90000

## 2012-09-03 ENCOUNTER — Ambulatory Visit (INDEPENDENT_AMBULATORY_CARE_PROVIDER_SITE_OTHER): Payer: Medicaid Other | Admitting: Obstetrics

## 2012-09-03 ENCOUNTER — Encounter: Payer: Self-pay | Admitting: Obstetrics

## 2012-09-03 VITALS — BP 104/73 | HR 71 | Temp 98.2°F | Ht 65.0 in | Wt 196.0 lb

## 2012-09-03 DIAGNOSIS — N760 Acute vaginitis: Secondary | ICD-10-CM

## 2012-09-03 DIAGNOSIS — Z113 Encounter for screening for infections with a predominantly sexual mode of transmission: Secondary | ICD-10-CM

## 2012-09-03 DIAGNOSIS — Z01419 Encounter for gynecological examination (general) (routine) without abnormal findings: Secondary | ICD-10-CM

## 2012-09-03 DIAGNOSIS — Z0000 Encounter for general adult medical examination without abnormal findings: Secondary | ICD-10-CM

## 2012-09-03 DIAGNOSIS — Z124 Encounter for screening for malignant neoplasm of cervix: Secondary | ICD-10-CM

## 2012-09-03 NOTE — Progress Notes (Signed)
Subjective:     [name] is a 37 y.o. female here for a routine exam.  Current complaints: vaginitis.  Personal health questionnaire reviewed: not asked.   Gynecologic History Patient's last menstrual period was 08/22/2012. Contraception: none Last Pap: n/a. Results were: n/a Last mammogram: n/a. Results were: n/a  Obstetric History OB History   Grav Para Term Preterm Abortions TAB SAB Ect Mult Living   2 0   2 1 1    0     # Outc Date GA Lbr Len/2nd Wgt Sex Del Anes PTL Lv   1 SAB            2 TAB                The following portions of the patient's history were reviewed and updated as appropriate: allergies, current medications, past family history, past medical history, past social history, past surgical history and problem list.  Review of Systems Pertinent items are noted in HPI.    Objective:    General appearance: alert and no distress Breasts: normal appearance, no masses or tenderness Abdomen: normal findings: soft, non-tender Pelvic: cervix normal in appearance, external genitalia normal, no adnexal masses or tenderness, no cervical motion tenderness, uterus normal size, shape, and consistency and vagina normal without discharge    Assessment:    Healthy female exam.    Plan:    Contraception: none. F/U 1 year.

## 2012-09-04 LAB — WET PREP BY MOLECULAR PROBE
Candida species: NEGATIVE
Gardnerella vaginalis: NEGATIVE
Trichomonas vaginosis: NEGATIVE

## 2012-09-04 LAB — PAP IG W/ RFLX HPV ASCU

## 2012-09-04 LAB — HEPATITIS B SURFACE ANTIGEN: Hepatitis B Surface Ag: NEGATIVE

## 2012-09-04 LAB — HIV ANTIBODY (ROUTINE TESTING W REFLEX): HIV: NONREACTIVE

## 2012-09-04 LAB — RPR

## 2012-09-05 ENCOUNTER — Encounter: Payer: Self-pay | Admitting: Obstetrics

## 2012-09-05 LAB — HUMAN PAPILLOMAVIRUS, HIGH RISK: HPV DNA High Risk: DETECTED — AB

## 2012-09-19 NOTE — Progress Notes (Signed)
LM ON VM TO CB.

## 2012-09-25 ENCOUNTER — Encounter: Payer: Self-pay | Admitting: *Deleted

## 2012-11-18 ENCOUNTER — Encounter: Payer: Self-pay | Admitting: Obstetrics

## 2012-11-18 ENCOUNTER — Ambulatory Visit (INDEPENDENT_AMBULATORY_CARE_PROVIDER_SITE_OTHER): Payer: Medicaid Other | Admitting: Obstetrics

## 2012-11-18 VITALS — BP 125/81 | HR 74 | Temp 98.3°F | Wt 193.0 lb

## 2012-11-18 DIAGNOSIS — N760 Acute vaginitis: Secondary | ICD-10-CM

## 2012-11-18 DIAGNOSIS — R109 Unspecified abdominal pain: Secondary | ICD-10-CM

## 2012-11-18 DIAGNOSIS — Z3202 Encounter for pregnancy test, result negative: Secondary | ICD-10-CM

## 2012-11-18 DIAGNOSIS — Z113 Encounter for screening for infections with a predominantly sexual mode of transmission: Secondary | ICD-10-CM

## 2012-11-18 LAB — POCT URINALYSIS DIPSTICK
Bilirubin, UA: NEGATIVE
Blood, UA: NEGATIVE
Glucose, UA: NEGATIVE
Ketones, UA: NEGATIVE
Nitrite, UA: NEGATIVE
Protein, UA: NEGATIVE
Spec Grav, UA: 1.01
Urobilinogen, UA: NEGATIVE
pH, UA: 8

## 2012-11-18 LAB — POCT URINE PREGNANCY: Preg Test, Ur: NEGATIVE

## 2012-11-18 NOTE — Progress Notes (Signed)
Subjective:     [name] is a 37 y.o. female here for a problem visit.  Current complaints: vaginal discharge prior to LMP with "tingling".  Personal health questionnaire reviewed: not asked.   Gynecologic History Patient's last menstrual period was 11/02/2012. Contraception: none  Obstetric History OB History   Grav Para Term Preterm Abortions TAB SAB Ect Mult Living   2 0   2 1 1    0     # Outc Date GA Lbr Len/2nd Wgt Sex Del Anes PTL Lv   1 SAB            2 TAB                The following portions of the patient's history were reviewed and updated as appropriate: allergies, current medications, past family history, past medical history, past social history, past surgical history and problem list.  Review of Systems Pertinent items are noted in HPI.    Objective:    General appearance: alert and no distress Abdomen: normal findings: soft, non-tender Pelvic: cervix normal in appearance, external genitalia normal, no adnexal masses or tenderness, no cervical motion tenderness, uterus normal size, shape, and consistency and vagina normal without discharge    Assessment:    Healthy female exam.    Plan:    F/U prn.

## 2012-11-19 LAB — WET PREP BY MOLECULAR PROBE
Candida species: NEGATIVE
Gardnerella vaginalis: NEGATIVE
Trichomonas vaginosis: NEGATIVE

## 2012-11-19 LAB — GC/CHLAMYDIA PROBE AMP
CT Probe RNA: NEGATIVE
GC Probe RNA: NEGATIVE

## 2013-03-12 ENCOUNTER — Encounter: Payer: Self-pay | Admitting: Obstetrics

## 2013-03-12 ENCOUNTER — Ambulatory Visit (INDEPENDENT_AMBULATORY_CARE_PROVIDER_SITE_OTHER): Payer: Medicaid Other | Admitting: Obstetrics

## 2013-03-12 VITALS — BP 132/86 | HR 65 | Temp 98.7°F | Ht 60.0 in | Wt 180.0 lb

## 2013-03-12 DIAGNOSIS — R87810 Cervical high risk human papillomavirus (HPV) DNA test positive: Secondary | ICD-10-CM | POA: Insufficient documentation

## 2013-03-12 NOTE — Addendum Note (Signed)
Addended by: George Hugh on: 03/12/2013 04:26 PM   Modules accepted: Orders

## 2013-03-12 NOTE — Progress Notes (Signed)
Subjective:     [name] is a 37 y.o. female here for a routine exam.  Current complaints: Patient is in the office for follow up to an abnormal pap that was done at her primary care on Oct 7. Patient states she was told inflammation of cervix and bleeding from cervix. Patient states she went to her doctor about a red rash she had in her groin. It has since migrated unto the vulva. Patient states it is sensitive, but not painful. Patient states she was treated with cream and an ATB injection. Patient states she did not pick up her additional Rx at the pharmacy.  Personal health questionnaire reviewed: yes.   Gynecologic History Patient's last menstrual period was 03/03/2013. Contraception: none Last Pap: 08/2012- ASCUS/ + HPV at [facility]. 02/2013-    Obstetric History OB History  Gravida Para Term Preterm AB SAB TAB Ectopic Multiple Living  2 0   2 1 1    0    # Outcome Date GA Lbr Len/2nd Weight Sex Delivery Anes PTL Lv  2 TAB           1 SAB                The following portions of the patient's history were reviewed and updated as appropriate: allergies, current medications, past family history, past medical history, past social history, past surgical history and problem list. ferred. Review of Systems Pertinent items are noted in HPI.    Objective:    PE:        Deferred.  Assessment:    ASCUS with positive High Risk HPV.   Plan:    Education reviewed: Management of abnormal pap smears.. Follow up in: 6 months.

## 2013-03-13 LAB — WET PREP BY MOLECULAR PROBE
Candida species: NEGATIVE
Gardnerella vaginalis: NEGATIVE
Trichomonas vaginosis: NEGATIVE

## 2013-09-10 ENCOUNTER — Ambulatory Visit: Payer: Medicaid Other | Admitting: Obstetrics

## 2013-10-06 ENCOUNTER — Other Ambulatory Visit: Payer: Medicaid Other

## 2013-10-06 ENCOUNTER — Encounter: Payer: Medicaid Other | Admitting: Obstetrics

## 2013-10-26 ENCOUNTER — Emergency Department (HOSPITAL_COMMUNITY)
Admission: EM | Admit: 2013-10-26 | Discharge: 2013-10-26 | Disposition: A | Discharge status: Home / Self Care | Payer: Medicaid Other | Attending: Emergency Medicine | Admitting: Emergency Medicine

## 2013-10-26 ENCOUNTER — Emergency Department (HOSPITAL_COMMUNITY): Payer: Medicaid Other

## 2013-10-26 ENCOUNTER — Encounter (HOSPITAL_COMMUNITY): Payer: Self-pay | Admitting: Emergency Medicine

## 2013-10-26 DIAGNOSIS — Z8659 Personal history of other mental and behavioral disorders: Secondary | ICD-10-CM | POA: Insufficient documentation

## 2013-10-26 DIAGNOSIS — Z8619 Personal history of other infectious and parasitic diseases: Secondary | ICD-10-CM | POA: Insufficient documentation

## 2013-10-26 DIAGNOSIS — S5010XA Contusion of unspecified forearm, initial encounter: Secondary | ICD-10-CM | POA: Insufficient documentation

## 2013-10-26 DIAGNOSIS — I10 Essential (primary) hypertension: Secondary | ICD-10-CM | POA: Insufficient documentation

## 2013-10-26 DIAGNOSIS — R011 Cardiac murmur, unspecified: Secondary | ICD-10-CM | POA: Insufficient documentation

## 2013-10-26 DIAGNOSIS — M898X1 Other specified disorders of bone, shoulder: Secondary | ICD-10-CM

## 2013-10-26 DIAGNOSIS — S0993XA Unspecified injury of face, initial encounter: Secondary | ICD-10-CM | POA: Insufficient documentation

## 2013-10-26 DIAGNOSIS — S60219A Contusion of unspecified wrist, initial encounter: Secondary | ICD-10-CM | POA: Insufficient documentation

## 2013-10-26 DIAGNOSIS — Z79899 Other long term (current) drug therapy: Secondary | ICD-10-CM | POA: Insufficient documentation

## 2013-10-26 DIAGNOSIS — Z8709 Personal history of other diseases of the respiratory system: Secondary | ICD-10-CM | POA: Insufficient documentation

## 2013-10-26 DIAGNOSIS — S199XXA Unspecified injury of neck, initial encounter: Principal | ICD-10-CM

## 2013-10-26 DIAGNOSIS — M542 Cervicalgia: Secondary | ICD-10-CM

## 2013-10-26 NOTE — ED Notes (Signed)
Pt on phone with CSW from [facility].

## 2013-10-26 NOTE — Discharge Instructions (Signed)
1. Medications: ibuprofen 800mg  three times per day as needed, usual home medications 2. Treatment: rest, drink plenty of fluids,  3. Follow Up: Please followup with your primary doctor for discussion of your diagnoses and further evaluation after today's visit; if you do not have a primary care doctor use the resource guide provided to find one;     Cervical Sprain A cervical sprain is an injury in the neck in which the strong, fibrous tissues (ligaments) that connect your neck bones stretch or tear. Cervical sprains can range from mild to severe. Severe cervical sprains can cause the neck vertebrae to be unstable. This can lead to damage of the spinal cord and can result in serious nervous system problems. The amount of time it takes for a cervical sprain to get better depends on the cause and extent of the injury. Most cervical sprains heal in 1 to 3 weeks. CAUSES  Severe cervical sprains may be caused by:   Contact sport injuries (such as from football, rugby, wrestling, hockey, auto racing, gymnastics, diving, martial arts, or boxing).   Motor vehicle collisions.   Whiplash injuries. This is an injury from a sudden forward-and backward whipping movement of the head and neck.  Falls.  Mild cervical sprains may be caused by:   Being in an awkward position, such as while cradling a telephone between your ear and shoulder.   Sitting in a chair that does not offer proper support.   Working at a poorly Landscape [employer] station.   Looking up or down for long periods of time.  SYMPTOMS   Pain, soreness, stiffness, or a burning sensation in the front, back, or sides of the neck. This discomfort may develop immediately after the injury or slowly, 24 hours or more after the injury.   Pain or tenderness directly in the middle of the back of the neck.   Shoulder or upper back pain.   Limited ability to move the neck.   Headache.   Dizziness.   Weakness, numbness, or  tingling in the hands or arms.   Muscle spasms.   Difficulty swallowing or chewing.   Tenderness and swelling of the neck.  DIAGNOSIS  Most of the time your health care provider can diagnose a cervical sprain by taking your history and doing a physical exam. Your health care provider will ask about previous neck injuries and any known neck problems, such as arthritis in the neck. X-rays may be taken to find out if there are any other problems, such as with the bones of the neck. Other tests, such as a CT scan or MRI, may also be needed.  TREATMENT  Treatment depends on the severity of the cervical sprain. Mild sprains can be treated with rest, keeping the neck in place (immobilization), and pain medicines. Severe cervical sprains are immediately immobilized. Further treatment is done to help with pain, muscle spasms, and other symptoms and may include:  Medicines, such as pain relievers, numbing medicines, or muscle relaxants.   Physical therapy. This may involve stretching exercises, strengthening exercises, and posture training. Exercises and improved posture can help stabilize the neck, strengthen muscles, and help stop symptoms from returning.  HOME CARE INSTRUCTIONS   Put ice on the injured area.   Put ice in a plastic bag.   Place a towel between your skin and the bag.   Leave the ice on for 15 20 minutes, 3 4 times a day.   If your injury was severe, you may have  been given a cervical collar to wear. A cervical collar is a two-piece collar designed to keep your neck from moving while it heals.  Do not remove the collar unless instructed by your health care provider.  If you have long hair, keep it outside of the collar.  Ask your health care provider before making any adjustments to your collar. Minor adjustments may be required over time to improve comfort and reduce pressure on your chin or on the back of your head.  Ifyou are allowed to remove the collar for  cleaning or bathing, follow your health care provider's instructions on how to do so safely.  Keep your collar clean by wiping it with mild soap and water and drying it completely. If the collar you have been given includes removable pads, remove them every 1 2 days and hand wash them with soap and water. Allow them to air dry. They should be completely dry before you wear them in the collar.  If you are allowed to remove the collar for cleaning and bathing, wash and dry the skin of your neck. Check your skin for irritation or sores. If you see any, tell your health care provider.  Do not drive while wearing the collar.   Only take over-the-counter or prescription medicines for pain, discomfort, or fever as directed by your health care provider.   Keep all follow-up appointments as directed by your health care provider.   Keep all physical therapy appointments as directed by your health care provider.   Make any needed adjustments to your workstation to promote good posture.   Avoid positions and activities that make your symptoms worse.   Warm up and stretch before being active to help prevent problems.  SEEK MEDICAL CARE IF:   Your pain is not controlled with medicine.   You are unable to decrease your pain medicine over time as planned.   Your activity level is not improving as expected.  SEEK IMMEDIATE MEDICAL CARE IF:   You develop any bleeding.  You develop stomach upset.  You have signs of an allergic reaction to your medicine.   Your symptoms get worse.   You develop new, unexplained symptoms.   You have numbness, tingling, weakness, or paralysis in any part of your body.  MAKE SURE YOU:   Understand these instructions.  Will watch your condition.  Will get help right away if you are not doing well or get worse. Document Released: 02/25/2007 Document Revised: 02/18/2013 Document Reviewed: 11/05/2012 [facility] Patient Information 2014 [place].
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
## 2013-10-26 NOTE — ED Notes (Signed)
Called SW to speak with pt
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
## 2013-10-26 NOTE — ED Notes (Signed)
Discharge and follow up instructions reviewed. Pt verbalized understanding.  

## 2013-10-26 NOTE — ED Notes (Signed)
Case manager at bedside 
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
## 2013-10-27 NOTE — ED Provider Notes (Signed)
Medical screening examination/treatment/procedure(s) were performed by non-physician practitioner and as supervising physician I was immediately available for consultation/collaboration.   EKG Interpretation None       Richarda Blade, MD 10/27/13 1247

## 2013-12-29 ENCOUNTER — Telehealth: Payer: Self-pay

## 2013-12-29 ENCOUNTER — Encounter: Payer: Self-pay | Admitting: Obstetrics

## 2013-12-29 ENCOUNTER — Ambulatory Visit (INDEPENDENT_AMBULATORY_CARE_PROVIDER_SITE_OTHER): Payer: Medicaid Other | Admitting: Obstetrics

## 2013-12-29 ENCOUNTER — Other Ambulatory Visit: Payer: Self-pay | Admitting: Obstetrics

## 2013-12-29 VITALS — BP 136/90 | HR 70 | Temp 97.9°F | Ht 64.25 in | Wt 178.0 lb

## 2013-12-29 DIAGNOSIS — Z0000 Encounter for general adult medical examination without abnormal findings: Secondary | ICD-10-CM

## 2013-12-29 DIAGNOSIS — L680 Hirsutism: Secondary | ICD-10-CM

## 2013-12-29 LAB — HEMOGLOBIN A1C
Hgb A1c MFr Bld: 5.3 % (ref <5.7)
Mean Plasma Glucose: 105 mg/dL (ref <117)

## 2013-12-29 NOTE — Patient Instructions (Signed)
Hirsutism and Masculinization Hirsutism (increased body hair) is the growth of colored (pigmented) thick hair in women. It is most noticeable when it is on the moustache or beard areas. The other common sites are the:  Chest.  Abdomen.  Thighs.  Back. Pubic hair growth may run upward from the usual bikini line to the middle of the abdomen.  Virilism (masculinization) is more extensive than hirsutism. It has extra symptoms. There may be:  Acne.  Oily skin.  Baldness.  Enlargement of the clitoris.  Increased sex drive (libido).  Voice deepening.  Reduced breast size.  Irregular or absent periods.  Aggression. The scalp hair growth may also bald in a typical female pattern. CAUSES  This is caused by too much female sex hormone (androgen) in the body. It can be produced by the ovaries, adrenal glands, and within the skin. Hirsutism is most commonly related to polycystic ovarian syndrome (PCOS). The first signs of increased androgen levels are hirsutism and acne. How sensitive each person is to hormone levels varies greatly. Virilism may result from higher androgen levels. Some women with hirsutism have normal hormone levels. Eventually there may be female pattern balding. These problems are also connected to difficulty in having children (infertility). In addition, both malignant and benign tumors may cause hirsutism such as tumors of the adrenal gland (adenomas or adenocarcinomas) but this is a rare cause. There is also evidence that insulin resistance may cause the androgynism. This problem is treated with some success with a medicine for diabetes (metformin). Causes that come from outside the body (exogenous) may also lead to hirsutism such as intake of androgens by mouth.  Note: Women of [place], [place], and [place] commonly have facial, abdominal, and thigh hair that is normal for them.  TREATMENT  There are medical treatments that inhibit these  conditions, such as:  Combined oral contraceptive pills, if you are not trying to become pregnant.  Medicines that stop the production of hormones (gonadotropins).  Steroids. This may be used if there is evidence of congenital (present since birth) adrenal hyperplasia (abnormal growth of cells).  Metformin for the treatment of virilization, if sensitive to insulin.  Suppression of ovarian hormone production with GnRH analogues (a hormone). They can only be used by themselves for short periods of time. There are a variety of cosmetic treatments. These may be all that you need. They may be effective against occasional problems.  Shaving is the simplest and most effective in the short term. Bleaching is not usually suitable for severe hirsutism.  Plucking, waxing, sugaring (similar to waxing), and depilatory creams are effective. However, on occasion, they can result in skin irritation (inflammation) or infection.  Electrolysis is effective. Your caregiver can help you decide what needs to be done and what course of treatment will be best for you. Your caregiver may refer you to an endocrinologist. This is a physician who is specialized in the treatment of glandular disorders. Document Released: 07/09/2001 Document Revised: 09/14/2013 Document Reviewed: 08/25/2008 [facility] Patient Information 2015 [place], [place]. This information is not intended to replace advice given to you by your health care provider. Make sure you discuss any questions you have with your health care provider. Polycystic Ovarian Syndrome Polycystic ovarian syndrome (PCOS) is a common hormonal disorder among women of reproductive age. Most women with PCOS grow many small cysts on their ovaries. PCOS can cause problems with your periods and make it difficult to get pregnant. It can also cause an increased risk  of miscarriage with pregnancy. If left untreated, PCOS can lead to serious health problems, such as diabetes and heart  disease. CAUSES The cause of PCOS is not fully understood, but genetics may be a factor. SIGNS AND SYMPTOMS   Infrequent or no menstrual periods.   Inability to get pregnant (infertility) because of not ovulating.   Increased growth of hair on the face, chest, stomach, back, thumbs, thighs, or toes.   Acne, oily skin, or dandruff.   Pelvic pain.   Weight gain or obesity, usually carrying extra weight around the waist.   Type 2 diabetes.   High cholesterol.   High blood pressure.   Female-pattern baldness or thinning hair.   Patches of thickened and dark brown or black skin on the neck, arms, breasts, or thighs.   Tiny excess flaps of skin (skin tags) in the armpits or neck area.   Excessive snoring and having breathing stop at times while asleep (sleep apnea).   Deepening of the voice.   Gestational diabetes when pregnant.  DIAGNOSIS  There is no single test to diagnose PCOS.   Your health care provider will:   Take a medical history.   Perform a pelvic exam.   Have ultrasonography done.   Check your female and female hormone levels.   Measure glucose or sugar levels in the blood.   Do other blood tests.   If you are producing too many female hormones, your health care provider will make sure it is from PCOS. At the physical exam, your health care provider will want to evaluate the areas of increased hair growth. Try to allow natural hair growth for a few days before the visit.   During a pelvic exam, the ovaries may be enlarged or swollen because of the increased number of small cysts. This can be seen more easily by using vaginal ultrasonography or screening to examine the ovaries and lining of the uterus (endometrium) for cysts. The uterine lining may become thicker if you have not been having a regular period.  TREATMENT  Because there is no cure for PCOS, it needs to be managed to prevent problems. Treatments are based on your symptoms.  Treatment is also based on whether you want to have a baby or whether you need contraception.  Treatment may include:   Progesterone hormone to start a menstrual period.   Birth control pills to make you have regular menstrual periods.   Medicines to make you ovulate, if you want to get pregnant.   Medicines to control your insulin.   Medicine to control your blood pressure.   Medicine and diet to control your high cholesterol and triglycerides in your blood.  Medicine to reduce excessive hair growth.  Surgery, making small holes in the ovary, to decrease the amount of female hormone production. This is done through a long, lighted tube (laparoscope) placed into the pelvis through a tiny incision in the lower abdomen.  HOME CARE INSTRUCTIONS  Only take over-the-counter or prescription medicine as directed by your health care provider.  Pay attention to the foods you eat and your activity levels. This can help reduce the effects of PCOS.  Keep your weight under control.  Eat foods that are low in carbohydrate and high in fiber.  Exercise regularly. SEEK MEDICAL CARE IF:  Your symptoms do not get better with medicine.  You have new symptoms. Document Released: 08/24/2004 Document Revised: 02/18/2013 Document Reviewed: 10/16/2012 [facility] Patient Information 2015 [place], [place]. This information is not intended  to replace advice given to you by your health care provider. Make sure you discuss any questions you have with your health care provider.  

## 2013-12-29 NOTE — Telephone Encounter (Signed)
gave u/s appt time and date 8/28 @ 3:15 and phone number to wh in case she wanted to change it

## 2013-12-30 ENCOUNTER — Encounter: Payer: Self-pay | Admitting: Obstetrics

## 2013-12-30 LAB — WET PREP BY MOLECULAR PROBE
Candida species: NEGATIVE
Gardnerella vaginalis: NEGATIVE
Trichomonas vaginosis: NEGATIVE

## 2013-12-30 LAB — TESTOSTERONE, FREE, TOTAL, SHBG
Sex Hormone Binding: 23 nmol/L (ref 18–114)
Testosterone, Free: 9.8 pg/mL — ABNORMAL HIGH (ref 0.6–6.8)
Testosterone-% Free: 2.2 % (ref 0.4–2.4)
Testosterone: 45 ng/dL (ref 10–70)

## 2013-12-30 LAB — GC/CHLAMYDIA PROBE AMP
CT Probe RNA: NEGATIVE
GC Probe RNA: NEGATIVE

## 2013-12-30 NOTE — Progress Notes (Signed)
Subjective:     [name] is a 38 y.o. female here for a routine exam.  Current complaints:  C/O undesirable facial hair growth.  ? PCOS.  Sister has PCOS.  Personal health questionnaire:  Is patient Ashkenazi Jewish, have a family history of breast and/or ovarian cancer: no Is there a family history of uterine cancer diagnosed at age < 24, gastrointestinal cancer, urinary tract cancer, family member who is a Field seismologist syndrome-associated carrier: no Is the patient overweight and hypertensive, family history of diabetes, personal history of gestational diabetes or PCOS: no Is patient over 85, have PCOS,  family history of premature CHD under age 89, diabetes, smoke, have hypertension or peripheral artery disease:  no At any time, has a partner hit, kicked or otherwise hurt or frightened you?: no Over the past 2 weeks, have you felt down, depressed or hopeless?: no Over the past 2 weeks, have you felt little interest or pleasure in doing things?:no   Gynecologic History Patient's last menstrual period was 12/10/2013. Contraception: none Last Pap:  8 / 15. Results were:  Normal Last mammogram: n/a. Results were: n/a  Obstetric History OB History  Gravida Para Term Preterm AB SAB TAB Ectopic Multiple Living  2 0   2 1 1    0    # Outcome Date GA Lbr Len/2nd Weight Sex Delivery Anes PTL Lv  2 TAB           1 SAB               Past Medical History  Diagnosis Date  . Hypertension   . Anxiety   . Depressed   . Strep pharyngitis   . Heart murmur   . Bronchitis     Past Surgical History  Procedure Laterality Date  . No past surgeries    . Oral surgery      Current outpatient prescriptions:lisinopril (PRINIVIL,ZESTRIL) 10 MG tablet, Take 1 tablet (10 mg total) by mouth daily., Disp: 30 tablet, Rfl: 1;  Nutritional Supplements (VITAMIN D MAINTENANCE PO), Take 1 capsule by mouth daily., Disp: , Rfl:  No Known Allergies  History  Substance Use Topics  . Smoking status: Never  Smoker   . Smokeless tobacco: Never Used  . Alcohol Use: No    Family History  Problem Relation Age of Onset  . Heart disease Mother   . Asthma    . Bronchitis Mother   . Hypertension        Review of Systems  Constitutional: negative for fatigue and weight loss Respiratory: negative for cough and wheezing Cardiovascular: negative for chest pain, fatigue and palpitations Gastrointestinal: negative for abdominal pain and change in bowel habits Musculoskeletal:negative for myalgias Neurological: negative for gait problems and tremors Behavioral/Psych: negative for abusive relationship, depression Endocrine: negative for temperature intolerance   Genitourinary:negative for abnormal menstrual periods, genital lesions, hot flashes, sexual problems and vaginal discharge Integument/breast: negative for breast lump, breast tenderness, nipple discharge.  Undesirable facial hair.    Objective:       BP 136/90  Pulse 70  Temp(Src) 97.9 F (36.6 C)  Ht 5' 4.25" (1.632 m)  Wt 178 lb (80.74 kg)  BMI 30.31 kg/m2  LMP 12/10/2013 General:   alert  Skin:   no rash or abnormalities.  Increased facial hair  Lungs:   clear to auscultation bilaterally  Heart:   regular rate and rhythm, S1, S2 normal, no murmur, click, rub or gallop  Breasts:   normal without suspicious masses, skin  or nipple changes or axillary nodes  Abdomen:  normal findings: no organomegaly, soft, non-tender and no hernia  Pelvis:  External genitalia: normal general appearance Urinary system: urethral meatus normal and bladder without fullness, nontender Vaginal: normal without tenderness, induration or masses Cervix: normal appearance Adnexa: normal bimanual exam Uterus: anteverted and non-tender, normal size   Lab Review Urine pregnancy test Labs reviewed yes Radiologic studies reviewed no    Assessment:    Healthy female exam.   Facial Hirsutism  R/O PCOS   Plan:   Ultrasound ordered Labs:   Testosterone, HgbA1C  Education reviewed: calcium supplements, low fat, low cholesterol diet, safe sex/STD prevention, self breast exams and weight bearing exercise. Follow up in: 1 year.   No orders of the defined types were placed in this encounter.   Orders Placed This Encounter  Procedures  . WET PREP BY MOLECULAR PROBE  . GC/Chlamydia Probe Amp  . US Transvaginal Non-OB    Standing Status: Future     Number of Occurrences:      Standing Expiration Date: 03/01/2015    Order Specific Question:  Reason for Exam (SYMPTOM  OR DIAGNOSIS REQUIRED)    Answer:  Hirsutism.  R/O PCOS.    Order Specific Question:  Preferred imaging location?    Answer:  [facility]  . US Pelvis Complete    Standing Status: Future     Number of Occurrences:      Standing Expiration Date: 03/01/2015    Order Specific Question:  Reason for Exam (SYMPTOM  OR DIAGNOSIS REQUIRED)    Answer:  Hirsutism    Order Specific Question:  Preferred imaging location?    Answer:  [facility]  . Testosterone, free, total  . HgB A1c

## 2013-12-31 LAB — PAP IG AND HPV HIGH-RISK: HPV DNA High Risk: DETECTED — AB

## 2014-01-03 NOTE — Progress Notes (Signed)
Repeat pap 6 months.

## 2014-01-08 ENCOUNTER — Ambulatory Visit (HOSPITAL_COMMUNITY)
Admission: RE | Admit: 2014-01-08 | Discharge: 2014-01-08 | Disposition: A | Discharge status: Home / Self Care | Payer: Medicaid Other | Source: Ambulatory Visit | Attending: Obstetrics | Admitting: Obstetrics

## 2014-01-08 DIAGNOSIS — D251 Intramural leiomyoma of uterus: Secondary | ICD-10-CM | POA: Insufficient documentation

## 2014-01-08 DIAGNOSIS — L680 Hirsutism: Secondary | ICD-10-CM

## 2014-01-08 DIAGNOSIS — R6889 Other general symptoms and signs: Secondary | ICD-10-CM | POA: Insufficient documentation

## 2014-01-19 ENCOUNTER — Ambulatory Visit: Payer: Medicaid Other | Admitting: Obstetrics

## 2014-01-22 ENCOUNTER — Ambulatory Visit (INDEPENDENT_AMBULATORY_CARE_PROVIDER_SITE_OTHER): Payer: Medicaid Other | Admitting: Obstetrics

## 2014-01-22 VITALS — BP 117/77 | HR 60 | Temp 98.3°F | Wt 172.0 lb

## 2014-01-22 DIAGNOSIS — N946 Dysmenorrhea, unspecified: Secondary | ICD-10-CM | POA: Insufficient documentation

## 2014-01-22 DIAGNOSIS — N939 Abnormal uterine and vaginal bleeding, unspecified: Principal | ICD-10-CM | POA: Insufficient documentation

## 2014-01-22 DIAGNOSIS — N926 Irregular menstruation, unspecified: Secondary | ICD-10-CM | POA: Insufficient documentation

## 2014-01-22 DIAGNOSIS — Z3009 Encounter for other general counseling and advice on contraception: Secondary | ICD-10-CM

## 2014-01-22 MED ORDER — LEVONORGESTREL-ETHINYL ESTRAD 0.15-30 MG-MCG PO TABS: 1.0000 | ORAL_TABLET | Freq: Every day | ORAL | Status: DC

## 2014-01-22 MED ORDER — IBUPROFEN 800 MG PO TABS: 800.0000 mg | ORAL_TABLET | Freq: Three times a day (TID) | ORAL | Status: DC | PRN

## 2014-01-24 ENCOUNTER — Encounter: Payer: Self-pay | Admitting: Obstetrics

## 2014-01-24 NOTE — Progress Notes (Signed)
Patient ID: [name], female   DOB: March 23, 1976, 38 y.o.   MRN: [id number]  Chief Complaint  Patient presents with  . Follow-up    menstral bleeding and test    HPI [name] is a 38 y.o. female.  Painful periods.  HPI  Past Medical History  Diagnosis Date  . Hypertension   . Anxiety   . Depressed   . Strep pharyngitis   . Heart murmur   . Bronchitis     Past Surgical History  Procedure Laterality Date  . No past surgeries    . Oral surgery      Family History  Problem Relation Age of Onset  . Heart disease Mother   . Asthma    . Bronchitis Mother   . Hypertension      Social History History  Substance Use Topics  . Smoking status: Never Smoker   . Smokeless tobacco: Never Used  . Alcohol Use: No    No Known Allergies  Current Outpatient Prescriptions  Medication Sig Dispense Refill  . lisinopril (PRINIVIL,ZESTRIL) 10 MG tablet Take 1 tablet (10 mg total) by mouth daily.  30 tablet  1  . Nutritional Supplements (VITAMIN D MAINTENANCE PO) Take 1 capsule by mouth daily.      Marland Kitchen ibuprofen (ADVIL,MOTRIN) 800 MG tablet Take 1 tablet (800 mg total) by mouth every 8 (eight) hours as needed.  30 tablet  5  . levonorgestrel-ethinyl estradiol (NORDETTE) 0.15-30 MG-MCG tablet Take 1 tablet by mouth daily.  1 Package  11   No current facility-administered medications for this visit.    Review of Systems Review of Systems Constitutional: negative for fatigue and weight loss Respiratory: negative for cough and wheezing Cardiovascular: negative for chest pain, fatigue and palpitations Gastrointestinal: negative for abdominal pain and change in bowel habits Genitourinary:negative Integument/breast: negative for nipple discharge Musculoskeletal:negative for myalgias Neurological: negative for gait problems and tremors Behavioral/Psych: negative for abusive relationship, depression Endocrine: negative for temperature intolerance     Blood pressure 117/77,  pulse 60, temperature 98.3 F (36.8 C), weight 172 lb (78.019 kg), last menstrual period 12/27/2013.  Physical Exam Physical Exam General:   alert  Skin:   no rash or abnormalities  Lungs:   clear to auscultation bilaterally  Heart:   regular rate and rhythm, S1, S2 normal, no murmur, click, rub or gallop  Breasts:   normal without suspicious masses, skin or nipple changes or axillary nodes  Abdomen:  normal findings: no organomegaly, soft, non-tender and no hernia  Pelvis:  External genitalia: normal general appearance Urinary system: urethral meatus normal and bladder without fullness, nontender Vaginal: normal without tenderness, induration or masses Cervix: normal appearance Adnexa: normal bimanual exam Uterus: anteverted and non-tender, normal size      Data Reviewed Labs  Assessment    Dysmenorrhea Counseling for contraception.     Plan    No orders of the defined types were placed in this encounter.   Meds ordered this encounter  Medications  . levonorgestrel-ethinyl estradiol (NORDETTE) 0.15-30 MG-MCG tablet    Sig: Take 1 tablet by mouth daily.    Dispense:  1 Package    Refill:  11  . ibuprofen (ADVIL,MOTRIN) 800 MG tablet    Sig: Take 1 tablet (800 mg total) by mouth every 8 (eight) hours as needed.    Dispense:  30 tablet    Refill:  5       Skiler Olden A 01/24/2014, 7:20 AM

## 2014-02-04 ENCOUNTER — Ambulatory Visit: Payer: Medicaid Other | Admitting: Podiatry

## 2014-02-14 ENCOUNTER — Emergency Department (HOSPITAL_COMMUNITY)
Admission: EM | Admit: 2014-02-14 | Discharge: 2014-02-14 | Disposition: A | Discharge status: Home / Self Care | Payer: Medicaid Other | Attending: Emergency Medicine | Admitting: Emergency Medicine

## 2014-02-14 ENCOUNTER — Encounter (HOSPITAL_COMMUNITY): Payer: Self-pay | Admitting: Emergency Medicine

## 2014-02-14 ENCOUNTER — Emergency Department (HOSPITAL_COMMUNITY): Payer: Medicaid Other

## 2014-02-14 DIAGNOSIS — S81012A Laceration without foreign body, left knee, initial encounter: Secondary | ICD-10-CM | POA: Diagnosis not present

## 2014-02-14 DIAGNOSIS — Z8659 Personal history of other mental and behavioral disorders: Secondary | ICD-10-CM | POA: Insufficient documentation

## 2014-02-14 DIAGNOSIS — Z8619 Personal history of other infectious and parasitic diseases: Secondary | ICD-10-CM | POA: Diagnosis not present

## 2014-02-14 DIAGNOSIS — I10 Essential (primary) hypertension: Secondary | ICD-10-CM | POA: Insufficient documentation

## 2014-02-14 DIAGNOSIS — Y9389 Activity, other specified: Secondary | ICD-10-CM | POA: Insufficient documentation

## 2014-02-14 DIAGNOSIS — R011 Cardiac murmur, unspecified: Secondary | ICD-10-CM | POA: Diagnosis not present

## 2014-02-14 DIAGNOSIS — Y9289 Other specified places as the place of occurrence of the external cause: Secondary | ICD-10-CM | POA: Insufficient documentation

## 2014-02-14 DIAGNOSIS — Z79899 Other long term (current) drug therapy: Secondary | ICD-10-CM | POA: Insufficient documentation

## 2014-02-14 DIAGNOSIS — Z23 Encounter for immunization: Secondary | ICD-10-CM | POA: Insufficient documentation

## 2014-02-14 DIAGNOSIS — Z8709 Personal history of other diseases of the respiratory system: Secondary | ICD-10-CM | POA: Insufficient documentation

## 2014-02-14 DIAGNOSIS — W270XXA Contact with workbench tool, initial encounter: Secondary | ICD-10-CM | POA: Diagnosis not present

## 2014-02-14 MED ORDER — TETANUS-DIPHTH-ACELL PERTUSSIS 5-2.5-18.5 LF-MCG/0.5 IM SUSP
0.5000 mL | Freq: Once | INTRAMUSCULAR | Status: AC
  Administered 2014-02-14: 0.5 mL via INTRAMUSCULAR
  Filled 2014-02-14: qty 0.5

## 2014-02-14 NOTE — ED Notes (Signed)
thge off duty gpd has talked to this pt concerning her fears and her options.  The female with her is not aware of the pts conversation with the gpd officer.  The pt does not want him to know
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
## 2014-02-14 NOTE — Discharge Instructions (Signed)

## 2014-02-14 NOTE — ED Notes (Signed)
The pt has not  Taken her bp meds for approx 4 days
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
## 2014-02-14 NOTE — ED Notes (Signed)
Swelling beneath the laceration
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
## 2014-02-22 ENCOUNTER — Ambulatory Visit: Payer: Medicaid Other | Admitting: Obstetrics

## 2014-03-01 ENCOUNTER — Ambulatory Visit (INDEPENDENT_AMBULATORY_CARE_PROVIDER_SITE_OTHER): Payer: Medicaid Other | Admitting: Obstetrics

## 2014-03-01 VITALS — BP 123/80 | HR 64 | Temp 98.9°F | Wt 174.0 lb

## 2014-03-01 DIAGNOSIS — N946 Dysmenorrhea, unspecified: Secondary | ICD-10-CM

## 2014-03-01 DIAGNOSIS — N939 Abnormal uterine and vaginal bleeding, unspecified: Secondary | ICD-10-CM

## 2014-03-02 ENCOUNTER — Encounter: Payer: Self-pay | Admitting: Obstetrics

## 2014-03-02 NOTE — Progress Notes (Signed)
Patient ID: [name], female   DOB: 07/12/1975, 38 y.o.   MRN: [id number]  Chief Complaint  Patient presents with  . Gynecologic Exam    follow up to bleeding    HPI [name] is a 38 y.o. female.  Heavy and painful periods.  HPI  Past Medical History  Diagnosis Date  . Hypertension   . Anxiety   . Depressed   . Strep pharyngitis   . Heart murmur   . Bronchitis     Past Surgical History  Procedure Laterality Date  . No past surgeries    . Oral surgery      Family History  Problem Relation Age of Onset  . Heart disease Mother   . Asthma    . Bronchitis Mother   . Hypertension      Social History History  Substance Use Topics  . Smoking status: Never Smoker   . Smokeless tobacco: Never Used  . Alcohol Use: No    No Known Allergies  Current Outpatient Prescriptions  Medication Sig Dispense Refill  . albuterol (PROVENTIL HFA;VENTOLIN HFA) 108 (90 BASE) MCG/ACT inhaler Inhale 1-2 puffs into the lungs every 6 (six) hours as needed for wheezing or shortness of breath.      . cholecalciferol (VITAMIN D) 1000 UNITS tablet Take 1,000 Units by mouth daily.      . clonazePAM (KLONOPIN) 0.5 MG tablet Take 0.5 mg by mouth 2 (two) times daily as needed for anxiety.      Marland Kitchen ibuprofen (ADVIL,MOTRIN) 800 MG tablet Take 800 mg by mouth every 8 (eight) hours as needed for mild pain.      Marland Kitchen lisinopril (PRINIVIL,ZESTRIL) 10 MG tablet Take 10 mg by mouth daily.       No current facility-administered medications for this visit.    Review of Systems Review of Systems Constitutional: negative for fatigue and weight loss Respiratory: negative for cough and wheezing Cardiovascular: negative for chest pain, fatigue and palpitations Gastrointestinal: negative for abdominal pain and change in bowel habits Genitourinary:negative Integument/breast: negative for nipple discharge Musculoskeletal:negative for myalgias Neurological: negative for gait problems and  tremors Behavioral/Psych: negative for abusive relationship, depression Endocrine: negative for temperature intolerance     Blood pressure 123/80, pulse 64, temperature 98.9 F (37.2 C), weight 174 lb (78.926 kg), last menstrual period 02/26/2014.  Physical Exam Physical Exam General:   alert  Skin:   no rash or abnormalities  Lungs:   clear to auscultation bilaterally  Heart:   regular rate and rhythm, S1, S2 normal, no murmur, click, rub or gallop  Breasts:   normal without suspicious masses, skin or nipple changes or axillary nodes  Abdomen:  normal findings: no organomegaly, soft, non-tender and no hernia  Pelvis:  External genitalia: normal general appearance Urinary system: urethral meatus normal and bladder without fullness, nontender Vaginal: normal without tenderness, induration or masses Cervix: normal appearance Adnexa: normal bimanual exam Uterus: anteverted and non-tender, normal size     Data Reviewed labs  Assessment    AUB  Dysmenorrhea    Plan    IUD and Ablation discussed.  Patient will consider options.   No orders of the defined types were placed in this encounter.   No orders of the defined types were placed in this encounter.      [name] A 03/02/2014, 6:30 AM

## 2014-03-15 ENCOUNTER — Encounter: Payer: Self-pay | Admitting: Obstetrics

## 2014-08-16 ENCOUNTER — Encounter: Payer: Self-pay | Admitting: Obstetrics

## 2014-08-16 ENCOUNTER — Ambulatory Visit (INDEPENDENT_AMBULATORY_CARE_PROVIDER_SITE_OTHER): Payer: Medicaid Other | Admitting: Obstetrics

## 2014-08-16 VITALS — BP 113/70 | HR 67 | Temp 98.8°F | Wt 181.0 lb

## 2014-08-16 DIAGNOSIS — N898 Other specified noninflammatory disorders of vagina: Secondary | ICD-10-CM | POA: Diagnosis not present

## 2014-08-16 DIAGNOSIS — N390 Urinary tract infection, site not specified: Secondary | ICD-10-CM

## 2014-08-16 DIAGNOSIS — R102 Pelvic and perineal pain: Secondary | ICD-10-CM | POA: Diagnosis not present

## 2014-08-16 LAB — POCT URINALYSIS DIPSTICK
Bilirubin, UA: NEGATIVE
Blood, UA: 250
Glucose, UA: NEGATIVE
Ketones, UA: NEGATIVE
Nitrite, UA: NEGATIVE
Spec Grav, UA: 1.02
Urobilinogen, UA: NEGATIVE
pH, UA: 5

## 2014-08-16 MED ORDER — FLUCONAZOLE 150 MG PO TABS: 150.0000 mg | ORAL_TABLET | Freq: Once | ORAL | Status: DC

## 2014-08-16 NOTE — Progress Notes (Signed)
Patient ID: [name], female   DOB: January 05, 1976, 39 y.o.   MRN: [id number]  Chief Complaint  Patient presents with  . Vaginitis    possible yeast infection after antibiotic.  Right lower pelvic pain, sharp.    HPI [name] is a 38 y.o. female.  Pain in RLQ x 2 months.  Denies N/V, diarrhea or constipation.  Also c/o vaginal irritation.  Took an antibiotic for yeast infection recently. HPI  Past Medical History  Diagnosis Date  . Hypertension   . Anxiety   . Depressed   . Strep pharyngitis   . Heart murmur   . Bronchitis     Past Surgical History  Procedure Laterality Date  . No past surgeries    . Oral surgery      Family History  Problem Relation Age of Onset  . Heart disease Mother   . Asthma    . Bronchitis Mother   . Hypertension      Social History History  Substance Use Topics  . Smoking status: Never Smoker   . Smokeless tobacco: Never Used  . Alcohol Use: No    No Known Allergies  Current Outpatient Prescriptions  Medication Sig Dispense Refill  . albuterol (PROVENTIL HFA;VENTOLIN HFA) 108 (90 BASE) MCG/ACT inhaler Inhale 1-2 puffs into the lungs every 6 (six) hours as needed for wheezing or shortness of breath.    . cholecalciferol (VITAMIN D) 1000 UNITS tablet Take 1,000 Units by mouth daily.    Marland Kitchen ibuprofen (ADVIL,MOTRIN) 800 MG tablet Take 800 mg by mouth every 8 (eight) hours as needed for mild pain.    Marland Kitchen lisinopril (PRINIVIL,ZESTRIL) 10 MG tablet Take 10 mg by mouth daily.    . clonazePAM (KLONOPIN) 0.5 MG tablet Take 0.5 mg by mouth 2 (two) times daily as needed for anxiety.    . fluconazole (DIFLUCAN) 150 MG tablet Take 1 tablet (150 mg total) by mouth once. 1 tablet 2   No current facility-administered medications for this visit.    Review of Systems Review of Systems Constitutional: negative for fatigue and weight loss Respiratory: negative for cough and wheezing Cardiovascular: negative for chest pain, fatigue and  palpitations Gastrointestinal: negative for abdominal pain and change in bowel habits Genitourinary: Pelvic pain.  Vaginal irritation. Integument/breast: negative for nipple discharge Musculoskeletal:negative for myalgias Neurological: negative for gait problems and tremors Behavioral/Psych: negative for abusive relationship, depression Endocrine: negative for temperature intolerance     Blood pressure 113/70, pulse 67, temperature 98.8 F (37.1 C), weight 181 lb (82.101 kg), last menstrual period 07/19/2014.  Physical Exam Physical Exam                Abdomen:  normal findings: no organomegaly, soft, non-tender and no hernia  Pelvis:  External genitalia: normal general appearance Urinary system: urethral meatus normal and bladder without fullness, nontender Vaginal: normal without tenderness, induration or masses Cervix: normal appearance Adnexa: RLQ tenderness.  No masses. Uterus: anteverted and mild tenderness right fundus, normal size      Data Reviewed Labs  Assessment     Pelvic Pain ( RLQ ) H/O UTI, treated Vaginal irritation.  Probable post antibiotic yeast infection     Plan    Ultrasound ordered Diflucan Rx F/U 2 weeks  Orders Placed This Encounter  Procedures  . SureSwab, Vaginosis/Vaginitis Plus  . Urine culture  . US Transvaginal Non-OB    Standing Status: Future     Number of Occurrences:      Standing Expiration Date:  10/16/2015    Order Specific Question:  Reason for Exam (SYMPTOM  OR DIAGNOSIS REQUIRED)    Answer:  Pelvic pain    Order Specific Question:  Preferred imaging location?    Answer:  [facility]  . US Pelvis Complete    Standing Status: Future     Number of Occurrences:      Standing Expiration Date: 10/16/2015    Order Specific Question:  Reason for Exam (SYMPTOM  OR DIAGNOSIS REQUIRED)    Answer:  Pelvic pain    Order Specific Question:  Preferred imaging location?    Answer:  [facility]  . POCT urinalysis dipstick    Meds ordered this encounter  Medications  . fluconazole (DIFLUCAN) 150 MG tablet    Sig: Take 1 tablet (150 mg total) by mouth once.    Dispense:  1 tablet    Refill:  2

## 2014-08-18 LAB — URINE CULTURE: Colony Count: 50000

## 2014-08-19 LAB — SURESWAB, VAGINOSIS/VAGINITIS PLUS
Atopobium vaginae: NOT DETECTED Log (cells/mL)
C. albicans, DNA: NOT DETECTED
C. glabrata, DNA: NOT DETECTED
C. parapsilosis, DNA: NOT DETECTED
C. trachomatis RNA, TMA: NOT DETECTED
C. tropicalis, DNA: NOT DETECTED
Gardnerella vaginalis: <4.7 Log (cells/mL)
LACTOBACILLUS SPECIES: 6.6 Log (cells/mL)
MEGASPHAERA SPECIES: NOT DETECTED Log (cells/mL)
N. gonorrhoeae RNA, TMA: NOT DETECTED
T. vaginalis RNA, QL TMA: NOT DETECTED

## 2014-08-24 ENCOUNTER — Ambulatory Visit (HOSPITAL_COMMUNITY)
Admission: RE | Admit: 2014-08-24 | Discharge: 2014-08-24 | Disposition: A | Discharge status: Home / Self Care | Payer: Medicaid Other | Source: Ambulatory Visit | Attending: Obstetrics | Admitting: Obstetrics

## 2014-08-24 DIAGNOSIS — R102 Pelvic and perineal pain: Secondary | ICD-10-CM

## 2014-08-24 DIAGNOSIS — R1031 Right lower quadrant pain: Secondary | ICD-10-CM | POA: Diagnosis not present

## 2014-08-24 DIAGNOSIS — D259 Leiomyoma of uterus, unspecified: Secondary | ICD-10-CM | POA: Insufficient documentation

## 2014-08-31 ENCOUNTER — Ambulatory Visit: Payer: Medicaid Other | Admitting: Obstetrics

## 2014-09-01 ENCOUNTER — Encounter: Payer: Self-pay | Admitting: Obstetrics

## 2014-09-01 ENCOUNTER — Ambulatory Visit (INDEPENDENT_AMBULATORY_CARE_PROVIDER_SITE_OTHER): Payer: Medicaid Other | Admitting: Obstetrics

## 2014-09-01 VITALS — BP 124/76 | HR 68 | Temp 97.8°F | Ht 65.0 in | Wt 182.0 lb

## 2014-09-01 DIAGNOSIS — D251 Intramural leiomyoma of uterus: Secondary | ICD-10-CM

## 2014-09-01 DIAGNOSIS — Z3169 Encounter for other general counseling and advice on procreation: Secondary | ICD-10-CM

## 2014-09-01 MED ORDER — CITRANATAL HARMONY 27-1-260 MG PO CAPS: 1.0000 | ORAL_CAPSULE | Freq: Every day | ORAL | Status: DC

## 2014-09-02 ENCOUNTER — Encounter: Payer: Self-pay | Admitting: Obstetrics

## 2014-09-02 NOTE — Progress Notes (Signed)
Patient ID: [name], female   DOB: Sep 23, 1975, 39 y.o.   MRN: [id number]  Chief Complaint  Patient presents with  . Follow-up    Review ultrasound results.     HPI [name] is a 39 y.o. female.  H/O heavy and prolonged periods.  Desires future fertility.  Presents to discuss ultrasound results.   HPI  Past Medical History  Diagnosis Date  . Hypertension   . Anxiety   . Depressed   . Strep pharyngitis   . Heart murmur   . Bronchitis     Past Surgical History  Procedure Laterality Date  . No past surgeries    . Oral surgery      Family History  Problem Relation Age of Onset  . Heart disease Mother   . Asthma    . Bronchitis Mother   . Hypertension      Social History History  Substance Use Topics  . Smoking status: Never Smoker   . Smokeless tobacco: Never Used  . Alcohol Use: No    No Known Allergies  Current Outpatient Prescriptions  Medication Sig Dispense Refill  . albuterol (PROVENTIL HFA;VENTOLIN HFA) 108 (90 BASE) MCG/ACT inhaler Inhale 1-2 puffs into the lungs every 6 (six) hours as needed for wheezing or shortness of breath.    . cholecalciferol (VITAMIN D) 1000 UNITS tablet Take 1,000 Units by mouth daily.    Marland Kitchen ibuprofen (ADVIL,MOTRIN) 800 MG tablet Take 800 mg by mouth every 8 (eight) hours as needed for mild pain.    Marland Kitchen lisinopril (PRINIVIL,ZESTRIL) 10 MG tablet Take 10 mg by mouth daily.    . clonazePAM (KLONOPIN) 0.5 MG tablet Take 0.5 mg by mouth 2 (two) times daily as needed for anxiety.    . fluconazole (DIFLUCAN) 150 MG tablet Take 1 tablet (150 mg total) by mouth once. (Patient not taking: Reported on 09/01/2014) 1 tablet 2  . Prenat-FeFmCb-DSS-FA-DHA w/o A (CITRANATAL HARMONY) 27-1-260 MG CAPS Take 1 capsule by mouth daily before breakfast. 30 capsule 11   No current facility-administered medications for this visit.    Review of Systems Review of Systems Constitutional: negative for fatigue and weight loss Respiratory:  negative for cough and wheezing Cardiovascular: negative for chest pain, fatigue and palpitations Gastrointestinal: negative for abdominal pain and change in bowel habits Genitourinary:negative Integument/breast: negative for nipple discharge Musculoskeletal:negative for myalgias Neurological: negative for gait problems and tremors Behavioral/Psych: negative for abusive relationship, depression Endocrine: negative for temperature intolerance     Blood pressure 124/76, pulse 68, temperature 97.8 F (36.6 C), height 5\' 5"  (1.651 m), weight 182 lb (82.555 kg), last menstrual period 08/19/2014.  Physical Exam Physical Exam:  Deferred  100% of 10 min visit spent on counseling and coordination of care.   Data Reviewed Ultrasound  Assessment     Uterine fibroids Preconception counseling    Plan    Will follow. Folic acid Rx and preconception counseling done.  No orders of the defined types were placed in this encounter.   Meds ordered this encounter  Medications  . Prenat-FeFmCb-DSS-FA-DHA w/o A (CITRANATAL HARMONY) 27-1-260 MG CAPS    Sig: Take 1 capsule by mouth daily before breakfast.    Dispense:  30 capsule    Refill:  11

## 2014-09-16 ENCOUNTER — Ambulatory Visit: Payer: Medicaid Other | Admitting: Obstetrics

## 2014-09-26 ENCOUNTER — Encounter (HOSPITAL_COMMUNITY): Payer: Self-pay | Admitting: Emergency Medicine

## 2014-09-26 ENCOUNTER — Emergency Department (HOSPITAL_COMMUNITY)
Admission: EM | Admit: 2014-09-26 | Discharge: 2014-09-26 | Disposition: A | Discharge status: Home / Self Care | Payer: Medicaid Other | Attending: Emergency Medicine | Admitting: Emergency Medicine

## 2014-09-26 DIAGNOSIS — S0003XA Contusion of scalp, initial encounter: Secondary | ICD-10-CM | POA: Diagnosis not present

## 2014-09-26 DIAGNOSIS — Y92009 Unspecified place in unspecified non-institutional (private) residence as the place of occurrence of the external cause: Secondary | ICD-10-CM

## 2014-09-26 DIAGNOSIS — Y999 Unspecified external cause status: Secondary | ICD-10-CM | POA: Insufficient documentation

## 2014-09-26 DIAGNOSIS — Y939 Activity, unspecified: Secondary | ICD-10-CM | POA: Diagnosis not present

## 2014-09-26 DIAGNOSIS — Y92000 Kitchen of unspecified non-institutional (private) residence as  the place of occurrence of the external cause: Secondary | ICD-10-CM | POA: Insufficient documentation

## 2014-09-26 DIAGNOSIS — Z8709 Personal history of other diseases of the respiratory system: Secondary | ICD-10-CM | POA: Insufficient documentation

## 2014-09-26 DIAGNOSIS — F419 Anxiety disorder, unspecified: Secondary | ICD-10-CM | POA: Diagnosis not present

## 2014-09-26 DIAGNOSIS — R011 Cardiac murmur, unspecified: Secondary | ICD-10-CM | POA: Diagnosis not present

## 2014-09-26 DIAGNOSIS — Z79899 Other long term (current) drug therapy: Secondary | ICD-10-CM | POA: Insufficient documentation

## 2014-09-26 DIAGNOSIS — I10 Essential (primary) hypertension: Secondary | ICD-10-CM | POA: Diagnosis not present

## 2014-09-26 DIAGNOSIS — R110 Nausea: Secondary | ICD-10-CM | POA: Insufficient documentation

## 2014-09-26 DIAGNOSIS — S0990XA Unspecified injury of head, initial encounter: Secondary | ICD-10-CM | POA: Diagnosis present

## 2014-09-26 DIAGNOSIS — W01198A Fall on same level from slipping, tripping and stumbling with subsequent striking against other object, initial encounter: Secondary | ICD-10-CM | POA: Insufficient documentation

## 2014-09-26 DIAGNOSIS — W19XXXA Unspecified fall, initial encounter: Secondary | ICD-10-CM

## 2014-09-26 MED ORDER — ONDANSETRON 8 MG PO TBDP
8.0000 mg | ORAL_TABLET | Freq: Once | ORAL | Status: AC
  Administered 2014-09-26: 8 mg via ORAL
  Filled 2014-09-26: qty 1

## 2014-09-26 NOTE — ED Provider Notes (Signed)
CSN: [id number]     Arrival date & time 09/26/14  0111 History   First MD Initiated Contact with Patient 09/26/14 0140     Chief Complaint  Patient presents with  . Head Injury     (Consider location/radiation/quality/duration/timing/severity/associated sxs/prior Treatment) HPI 39 year old female presents to the emergency department with complaint of head injury.  Patient slipped in the kitchen around midnight and struck the right parietal part of her head.  No LOC, no weakness or numbness.  No vomiting.  She has had some mild nausea.  Patient is concerned for bleeding in the brain and came to the emergency department.  No visual changes.  No ice applied to the area. Past Medical History  Diagnosis Date  . Hypertension   . Anxiety   . Depressed   . Strep pharyngitis   . Heart murmur   . Bronchitis    Past Surgical History  Procedure Laterality Date  . No past surgeries    . Oral surgery     Family History  Problem Relation Age of Onset  . Heart disease Mother   . Asthma    . Bronchitis Mother   . Hypertension     History  Substance Use Topics  . Smoking status: Never Smoker   . Smokeless tobacco: Never Used  . Alcohol Use: No   OB History    Gravida Para Term Preterm AB TAB SAB Ectopic Multiple Living   2 0   2 1 1    0     Review of Systems  See History of Present Illness; otherwise all other systems are reviewed and negative  Allergies  Review of patient's allergies indicates no known allergies.  Home Medications   Prior to Admission medications   Medication Sig Start Date End Date Taking? Authorizing Provider  albuterol (PROVENTIL HFA;VENTOLIN HFA) 108 (90 BASE) MCG/ACT inhaler Inhale 1-2 puffs into the lungs every 6 (six) hours as needed for wheezing or shortness of breath.    Historical Provider, MD  cholecalciferol (VITAMIN D) 1000 UNITS tablet Take 1,000 Units by mouth daily.    Historical Provider, MD  clonazePAM (KLONOPIN) 0.5 MG tablet Take 0.5 mg by  mouth 2 (two) times daily as needed for anxiety.    Historical Provider, MD  fluconazole (DIFLUCAN) 150 MG tablet Take 1 tablet (150 mg total) by mouth once. Patient not taking: Reported on 09/01/2014 08/16/14   Shelly Bombard, MD  ibuprofen (ADVIL,MOTRIN) 800 MG tablet Take 800 mg by mouth every 8 (eight) hours as needed for mild pain.    Historical Provider, MD  lisinopril (PRINIVIL,ZESTRIL) 10 MG tablet Take 10 mg by mouth daily.    Historical Provider, MD  Prenat-FeFmCb-DSS-FA-DHA w/o A (CITRANATAL HARMONY) 27-1-260 MG CAPS Take 1 capsule by mouth daily before breakfast. 09/01/14   Shelly Bombard, MD   BP 145/89 mmHg  Pulse 73  Temp(Src) 98.2 F (36.8 C) (Oral)  Resp 16  SpO2 100%  LMP 09/14/2014 Physical Exam  Constitutional: She is oriented to person, place, and time. She appears well-developed and well-nourished.  HENT:  Head: Normocephalic.  Right Ear: External ear normal.  Left Ear: External ear normal.  Nose: Nose normal.  Mouth/Throat: Oropharynx is clear and moist.  Patient has contusion and swelling to the right midline/parietal scalp without step-off or crepitus  Eyes: Conjunctivae and EOM are normal. Pupils are equal, round, and reactive to light.  Neck: Normal range of motion. Neck supple. No JVD present. No tracheal deviation present. No  thyromegaly present.  Cardiovascular: Normal rate, regular rhythm, normal heart sounds and intact distal pulses.  Exam reveals no gallop and no friction rub.   No murmur heard. Pulmonary/Chest: Effort normal and breath sounds normal. No stridor. No respiratory distress. She has no wheezes. She has no rales. She exhibits no tenderness.  Abdominal: Soft. Bowel sounds are normal. She exhibits no distension and no mass. There is no tenderness. There is no rebound and no guarding.  Musculoskeletal: Normal range of motion. She exhibits no edema or tenderness.  Lymphadenopathy:    She has no cervical adenopathy.  Neurological: She is alert  and oriented to person, place, and time. She displays normal reflexes. No cranial nerve deficit. She exhibits normal muscle tone. Coordination normal.  Skin: Skin is warm and dry. No rash noted. No erythema. No pallor.  Psychiatric: She has a normal mood and affect. Her behavior is normal. Judgment and thought content normal.  Nursing note and vitals reviewed.   ED Course  Procedures (including critical care time) Labs Review Labs Reviewed - No data to display  Imaging Review No results found.   EKG Interpretation None      MDM   Final diagnoses:  Fall at home, initial encounter  Scalp contusion, initial encounter    39 year old female with scalp contusion.  No signs of severe brain injury or concussion.  Patient stable for discharge home.  I went over precautions with her and her family.  I explained the reasons why I do not feel that she needs a CT scan of her brain at this time.  Linton Flemings, MD 09/26/14 [phone]

## 2014-09-26 NOTE — Discharge Instructions (Signed)
Cryotherapy °Cryotherapy means treatment with cold. Ice or gel packs can be used to reduce both pain and swelling. Ice is the most helpful within the first 24 to 48 hours after an injury or flare-up from overusing a muscle or joint. Sprains, strains, spasms, burning pain, shooting pain, and aches can all be eased with ice. Ice can also be used when recovering from surgery. Ice is effective, has very few side effects, and is safe for most people to use. °PRECAUTIONS  °Ice is not a safe treatment option for people with: °· Raynaud phenomenon. This is a condition affecting small blood vessels in the extremities. Exposure to cold may cause your problems to return. °· Cold hypersensitivity. There are many forms of cold hypersensitivity, including: °· Cold urticaria. Red, itchy hives appear on the skin when the tissues begin to warm after being iced. °· Cold erythema. This is a red, itchy rash caused by exposure to cold. °· Cold hemoglobinuria. Red blood cells break down when the tissues begin to warm after being iced. The hemoglobin that carry oxygen are passed into the urine because they cannot combine with blood proteins fast enough. °· Numbness or altered sensitivity in the area being iced. °If you have any of the following conditions, do not use ice until you have discussed cryotherapy with your caregiver: °· Heart conditions, such as arrhythmia, angina, or chronic heart disease. °· High blood pressure. °· Healing wounds or open skin in the area being iced. °· Current infections. °· Rheumatoid arthritis. °· Poor circulation. °· Diabetes. °Ice slows the blood flow in the region it is applied. This is beneficial when trying to stop inflamed tissues from spreading irritating chemicals to surrounding tissues. However, if you expose your skin to cold temperatures for too long or without the proper protection, you can damage your skin or nerves. Watch for signs of skin damage due to cold. °HOME CARE INSTRUCTIONS °Follow  these tips to use ice and cold packs safely. °· Place a dry or damp towel between the ice and skin. A damp towel will cool the skin more quickly, so you may need to shorten the time that the ice is used. °· For a more rapid response, add gentle compression to the ice. °· Ice for no more than 10 to 20 minutes at a time. The bonier the area you are icing, the less time it will take to get the benefits of ice. °· Check your skin after 5 minutes to make sure there are no signs of a poor response to cold or skin damage. °· Rest 20 minutes or more between uses. °· Once your skin is numb, you can end your treatment. You can test numbness by very lightly touching your skin. The touch should be so light that you do not see the skin dimple from the pressure of your fingertip. When using ice, most people will feel these normal sensations in this order: cold, burning, aching, and numbness. °· Do not use ice on someone who cannot communicate their responses to pain, such as small children or people with dementia. °HOW TO MAKE AN ICE PACK °Ice packs are the most common way to use ice therapy. Other methods include ice massage, ice baths, and cryosprays. Muscle creams that cause a cold, tingly feeling do not offer the same benefits that ice offers and should not be used as a substitute unless recommended by your caregiver. °To make an ice pack, do one of the following: °· Place crushed ice or a   bag of frozen vegetables in a sealable plastic bag. Squeeze out the excess air. Place this bag inside another plastic bag. Slide the bag into a pillowcase or place a damp towel between your skin and the bag.  Mix 3 parts water with 1 part rubbing alcohol. Freeze the mixture in a sealable plastic bag. When you remove the mixture from the freezer, it will be slushy. Squeeze out the excess air. Place this bag inside another plastic bag. Slide the bag into a pillowcase or place a damp towel between your skin and the bag. SEEK MEDICAL CARE  IF:  You develop white spots on your skin. This may give the skin a blotchy (mottled) appearance.  Your skin turns blue or pale.  Your skin becomes waxy or hard.  Your swelling gets worse. MAKE SURE YOU:   Understand these instructions.  Will watch your condition.  Will get help right away if you are not doing well or get worse. Document Released: 12/25/2010 Document Revised: 09/14/2013 Document Reviewed: 12/25/2010 [facility] Patient Information 2015 [place], [place]. This information is not intended to replace advice given to you by your health care provider. Make sure you discuss any questions you have with your health care provider.  Facial or Scalp Contusion A facial or scalp contusion is a deep bruise on the face or head. Injuries to the face and head generally cause a lot of swelling, especially around the eyes. Contusions are the result of an injury that caused bleeding under the skin. The contusion may turn blue, purple, or yellow. Minor injuries will give you a painless contusion, but more severe contusions may stay painful and swollen for a few weeks.  CAUSES  A facial or scalp contusion is caused by a blunt injury or trauma to the face or head area.  SIGNS AND SYMPTOMS   Swelling of the injured area.   Discoloration of the injured area.   Tenderness, soreness, or pain in the injured area.  DIAGNOSIS  The diagnosis can be made by taking a medical history and doing a physical exam. An X-ray exam, CT scan, or MRI may be needed to determine if there are any associated injuries, such as broken bones (fractures). TREATMENT  Often, the best treatment for a facial or scalp contusion is applying cold compresses to the injured area. Over-the-counter medicines may also be recommended for pain control.  HOME CARE INSTRUCTIONS   Only take over-the-counter or prescription medicines as directed by your health care provider.   Apply ice to the injured area.   Put ice in a  plastic bag.   Place a towel between your skin and the bag.   Leave the ice on for 20 minutes, 2-3 times a day.  SEEK MEDICAL CARE IF:  You have bite problems.   You have pain with chewing.   You are concerned about facial defects. SEEK IMMEDIATE MEDICAL CARE IF:  You have severe pain or a headache that is not relieved by medicine.   You have unusual sleepiness, confusion, or personality changes.   You throw up (vomit).   You have a persistent nosebleed.   You have double vision or blurred vision.   You have fluid drainage from your nose or ear.   You have difficulty walking or using your arms or legs.  MAKE SURE YOU:   Understand these instructions.  Will watch your condition.  Will get help right away if you are not doing well or get worse. Document Released: 06/07/2004 Document Revised:  02/18/2013 Document Reviewed: 12/11/2012 [employer] Patient Information 2015 [place], [place]. This information is not intended to replace advice given to you by your health care provider. Make sure you discuss any questions you have with your health care provider.  Fall Prevention and Home Safety Falls cause injuries and can affect all age groups. It is possible to use preventive measures to significantly decrease the likelihood of falls. There are many simple measures which can make your home safer and prevent falls. OUTDOORS  Repair cracks and edges of walkways and driveways.  Remove high doorway thresholds.  Trim shrubbery on the main path into your home.  Have good outside lighting.  Clear walkways of tools, rocks, debris, and clutter.  Check that handrails are not broken and are securely fastened. Both sides of steps should have handrails.  Have leaves, snow, and ice cleared regularly.  Use sand or salt on walkways during winter months.  In the garage, clean up grease or oil spills. BATHROOM  Install night lights.  Install grab bars by the toilet and in  the tub and shower.  Use non-skid mats or decals in the tub or shower.  Place a plastic non-slip stool in the shower to sit on, if needed.  Keep floors dry and clean up all water on the floor immediately.  Remove soap buildup in the tub or shower on a regular basis.  Secure bath mats with non-slip, double-sided rug tape.  Remove throw rugs and tripping hazards from the floors. BEDROOMS  Install night lights.  Make sure a bedside light is easy to reach.  Do not use oversized bedding.  Keep a telephone by your bedside.  Have a firm chair with side arms to use for getting dressed.  Remove throw rugs and tripping hazards from the floor. KITCHEN  Keep handles on pots and pans turned toward the center of the stove. Use back burners when possible.  Clean up spills quickly and allow time for drying.  Avoid walking on wet floors.  Avoid hot utensils and knives.  Position shelves so they are not too high or low.  Place commonly used objects within easy reach.  If necessary, use a sturdy step stool with a grab bar when reaching.  Keep electrical cables out of the way.  Do not use floor polish or wax that makes floors slippery. If you must use wax, use non-skid floor wax.  Remove throw rugs and tripping hazards from the floor. STAIRWAYS  Never leave objects on stairs.  Place handrails on both sides of stairways and use them. Fix any loose handrails. Make sure handrails on both sides of the stairways are as long as the stairs.  Check carpeting to make sure it is firmly attached along stairs. Make repairs to worn or loose carpet promptly.  Avoid placing throw rugs at the top or bottom of stairways, or properly secure the rug with carpet tape to prevent slippage. Get rid of throw rugs, if possible.  Have an electrician put in a light switch at the top and bottom of the stairs. OTHER FALL PREVENTION TIPS  Wear low-heel or rubber-soled shoes that are supportive and fit  well. Wear closed toe shoes.  When using a stepladder, make sure it is fully opened and both spreaders are firmly locked. Do not climb a closed stepladder.  Add color or contrast paint or tape to grab bars and handrails in your home. Place contrasting color strips on first and last steps.  Learn and use mobility aids  as needed. Install an electrical emergency response system.  Turn on lights to avoid dark areas. Replace light bulbs that burn out immediately. Get light switches that glow.  Arrange furniture to create clear pathways. Keep furniture in the same place.  Firmly attach carpet with non-skid or double-sided tape.  Eliminate uneven floor surfaces.  Select a carpet pattern that does not visually hide the edge of steps.  Be aware of all pets. OTHER HOME SAFETY TIPS  Set the water temperature for 120 F (48.8 C).  Keep emergency numbers on or near the telephone.  Keep smoke detectors on every level of the home and near sleeping areas. Document Released: 04/20/2002 Document Revised: 10/30/2011 Document Reviewed: 07/20/2011 [facility] Patient Information 2015 [place], [place]. This information is not intended to replace advice given to you by your health care provider. Make sure you discuss any questions you have with your health care provider.

## 2014-09-26 NOTE — ED Notes (Signed)
Pt reports slipped and hit top of head on the edge of counter about an hour ago. Pt denies headache, blurred vision and LOC. Pt states she has been nauseated.

## 2014-12-15 ENCOUNTER — Ambulatory Visit: Payer: Medicaid Other | Admitting: Obstetrics

## 2014-12-16 ENCOUNTER — Ambulatory Visit: Payer: Medicaid Other | Admitting: Obstetrics

## 2014-12-20 ENCOUNTER — Ambulatory Visit (INDEPENDENT_AMBULATORY_CARE_PROVIDER_SITE_OTHER): Payer: Medicaid Other | Admitting: Obstetrics

## 2014-12-20 ENCOUNTER — Encounter: Payer: Self-pay | Admitting: Obstetrics

## 2014-12-20 VITALS — BP 118/72 | HR 64 | Temp 97.6°F | Ht 66.0 in | Wt 178.0 lb

## 2014-12-20 DIAGNOSIS — L680 Hirsutism: Secondary | ICD-10-CM | POA: Diagnosis not present

## 2014-12-20 DIAGNOSIS — F418 Other specified anxiety disorders: Secondary | ICD-10-CM | POA: Diagnosis not present

## 2014-12-20 DIAGNOSIS — Z0000 Encounter for general adult medical examination without abnormal findings: Secondary | ICD-10-CM

## 2014-12-20 DIAGNOSIS — D251 Intramural leiomyoma of uterus: Secondary | ICD-10-CM

## 2014-12-20 DIAGNOSIS — Z01419 Encounter for gynecological examination (general) (routine) without abnormal findings: Secondary | ICD-10-CM

## 2014-12-20 DIAGNOSIS — I10 Essential (primary) hypertension: Secondary | ICD-10-CM | POA: Diagnosis not present

## 2014-12-20 DIAGNOSIS — Z124 Encounter for screening for malignant neoplasm of cervix: Secondary | ICD-10-CM

## 2014-12-20 DIAGNOSIS — IMO0002 Reserved for concepts with insufficient information to code with codable children: Secondary | ICD-10-CM

## 2014-12-20 DIAGNOSIS — R896 Abnormal cytological findings in specimens from other organs, systems and tissues: Secondary | ICD-10-CM | POA: Diagnosis not present

## 2014-12-20 NOTE — Progress Notes (Signed)
Subjective:        [name] is a 39 y.o. female here for a routine exam.  Current complaints: none.    Personal health questionnaire:  Is patient Ashkenazi Jewish, have a family history of breast and/or ovarian cancer: no Is there a family history of uterine cancer diagnosed at age < 52, gastrointestinal cancer, urinary tract cancer, family member who is a Field seismologist syndrome-associated carrier: no Is the patient overweight and hypertensive, family history of diabetes, personal history of gestational diabetes, preeclampsia or PCOS: no Is patient over 42, have PCOS,  family history of premature CHD under age 89, diabetes, smoke, have hypertension or peripheral artery disease:  no At any time, has a partner hit, kicked or otherwise hurt or frightened you?: no Over the past 2 weeks, have you felt down, depressed or hopeless?: no Over the past 2 weeks, have you felt little interest or pleasure in doing things?:no   Gynecologic History Patient's last menstrual period was 12/20/2014 (exact date). Contraception: none Last Pap: 2014. Results were: abnormal ( ASCUS with + HPV )  No follow up colposcopy done Last mammogram: 2016. Results were: normal  Obstetric History OB History  Gravida Para Term Preterm AB SAB TAB Ectopic Multiple Living  2 0   2 1 1    0    # Outcome Date GA Lbr Len/2nd Weight Sex Delivery Anes PTL Lv  2 TAB           1 SAB               Past Medical History  Diagnosis Date  . Hypertension   . Anxiety   . Depressed   . Strep pharyngitis   . Heart murmur   . Bronchitis     Past Surgical History  Procedure Laterality Date  . No past surgeries    . Oral surgery       Current outpatient prescriptions:  .  albuterol (PROVENTIL HFA;VENTOLIN HFA) 108 (90 BASE) MCG/ACT inhaler, Inhale 1-2 puffs into the lungs every 6 (six) hours as needed for wheezing or shortness of breath., Disp: , Rfl:  .  cholecalciferol (VITAMIN D) 1000 UNITS tablet, Take 1,000 Units by  mouth daily., Disp: , Rfl:  .  ibuprofen (ADVIL,MOTRIN) 800 MG tablet, Take 800 mg by mouth every 8 (eight) hours as needed for mild pain., Disp: , Rfl:  .  lisinopril (PRINIVIL,ZESTRIL) 10 MG tablet, Take 10 mg by mouth daily., Disp: , Rfl:  .  Prenat-FeFmCb-DSS-FA-DHA w/o A (CITRANATAL HARMONY) 27-1-260 MG CAPS, Take 1 capsule by mouth daily before breakfast., Disp: 30 capsule, Rfl: 11 .  clonazePAM (KLONOPIN) 0.5 MG tablet, Take 0.5 mg by mouth 2 (two) times daily as needed for anxiety., Disp: , Rfl:  .  fluconazole (DIFLUCAN) 150 MG tablet, Take 1 tablet (150 mg total) by mouth once. (Patient not taking: Reported on 09/01/2014), Disp: 1 tablet, Rfl: 2 No Known Allergies  History  Substance Use Topics  . Smoking status: Never Smoker   . Smokeless tobacco: Never Used  . Alcohol Use: No    Family History  Problem Relation Age of Onset  . Heart disease Mother   . Asthma    . Bronchitis Mother   . Hypertension        Review of Systems  Constitutional: negative for fatigue and weight loss Respiratory: negative for cough and wheezing Cardiovascular: negative for chest pain, fatigue and palpitations Gastrointestinal: negative for abdominal pain and change in bowel habits Musculoskeletal:negative  for myalgias Neurological: negative for gait problems and tremors Behavioral/Psych: negative for abusive relationship.  Positive for depression / anxiety Endocrine: negative for temperature intolerance   Genitourinary:negative for abnormal menstrual periods, genital lesions, hot flashes, sexual problems and vaginal discharge Integument/breast: negative for breast lump, breast tenderness, nipple discharge and skin lesion(s)    Objective:       BP 118/72 mmHg  Pulse 64  Temp(Src) 97.6 F (36.4 C)  Ht 5\' 6"  (1.676 m)  Wt 178 lb (80.74 kg)  BMI 28.74 kg/m2  LMP 12/20/2014 (Exact Date) General:   alert  Skin:   stubby facial hair from shaving  Lungs:   clear to auscultation bilaterally   Heart:   regular rate and rhythm, S1, S2 normal, no murmur, click, rub or gallop  Breasts:   normal without suspicious masses, skin or nipple changes or axillary nodes  Abdomen:  normal findings: no organomegaly, soft, non-tender and no hernia  Pelvis:  External genitalia: normal general appearance Urinary system: urethral meatus normal and bladder without fullness, nontender Vaginal: normal without tenderness, induration or masses Cervix: normal appearance Adnexa: normal bimanual exam Uterus: retroverted and non-tender, enlarged ~ 10-12 weeks   Lab Review Urine pregnancy test Labs reviewed yes Radiologic studies reviewed yes    Assessment:    Healthy female exam.    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  Needs colpo  Fibroids, stable clinically  Hirsutism, probable PCOS  HTN, stable on meds  Depression / Anxiety, stable on meds   Plan:    Education reviewed: calcium supplements, depression evaluation, low fat, low cholesterol diet, safe sex/STD prevention, self breast exams and weight bearing exercise. Contraception: none. Follow up in: 1 year.   No orders of the defined types were placed in this encounter.   No orders of the defined types were placed in this encounter.

## 2014-12-21 NOTE — Addendum Note (Signed)
Addended by: Lewie Loron D on: 12/21/2014 11:42 AM   Modules accepted: Orders

## 2014-12-23 LAB — PAP IG AND HPV HIGH-RISK: HPV DNA High Risk: NOT DETECTED

## 2014-12-24 LAB — SURESWAB, VAGINOSIS/VAGINITIS PLUS
Atopobium vaginae: NOT DETECTED Log (cells/mL)
C. albicans, DNA: NOT DETECTED
C. glabrata, DNA: NOT DETECTED
C. parapsilosis, DNA: NOT DETECTED
C. trachomatis RNA, TMA: NOT DETECTED
C. tropicalis, DNA: NOT DETECTED
Gardnerella vaginalis: NOT DETECTED Log (cells/mL)
LACTOBACILLUS SPECIES: 6.5 Log (cells/mL)
MEGASPHAERA SPECIES: NOT DETECTED Log (cells/mL)
N. gonorrhoeae RNA, TMA: NOT DETECTED
T. vaginalis RNA, QL TMA: NOT DETECTED

## 2015-01-06 ENCOUNTER — Encounter: Payer: Self-pay | Admitting: Obstetrics

## 2015-01-06 ENCOUNTER — Ambulatory Visit (INDEPENDENT_AMBULATORY_CARE_PROVIDER_SITE_OTHER): Payer: Medicaid Other | Admitting: Obstetrics

## 2015-01-06 VITALS — BP 146/90 | HR 56 | Temp 98.3°F | Wt 182.2 lb

## 2015-01-06 DIAGNOSIS — R87810 Cervical high risk human papillomavirus (HPV) DNA test positive: Secondary | ICD-10-CM

## 2015-01-06 NOTE — Progress Notes (Signed)
Patient ID: [name], female   DOB: 15-Jan-1976, 39 y.o.   MRN: [id number]  Chief Complaint  Patient presents with  . Gynecologic Exam    repeat pap    HPI [name] is a 39 y.o. female.  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  Presents for repeat pap.  She has no complaints.  HPI  Past Medical History  Diagnosis Date  . Hypertension   . Anxiety   . Depressed   . Strep pharyngitis   . Heart murmur   . Bronchitis     Past Surgical History  Procedure Laterality Date  . No past surgeries    . Oral surgery      Family History  Problem Relation Age of Onset  . Heart disease Mother   . Asthma    . Bronchitis Mother   . Hypertension      Social History Social History  Substance Use Topics  . Smoking status: Never Smoker   . Smokeless tobacco: Never Used  . Alcohol Use: No    No Known Allergies  Current Outpatient Prescriptions  Medication Sig Dispense Refill  . albuterol (PROVENTIL HFA;VENTOLIN HFA) 108 (90 BASE) MCG/ACT inhaler Inhale 1-2 puffs into the lungs every 6 (six) hours as needed for wheezing or shortness of breath.    . cholecalciferol (VITAMIN D) 1000 UNITS tablet Take 1,000 Units by mouth daily.    . clonazePAM (KLONOPIN) 0.5 MG tablet Take 0.5 mg by mouth 2 (two) times daily as needed for anxiety.    Marland Kitchen ibuprofen (ADVIL,MOTRIN) 800 MG tablet Take 800 mg by mouth every 8 (eight) hours as needed for mild pain.    Marland Kitchen lisinopril (PRINIVIL,ZESTRIL) 10 MG tablet Take 10 mg by mouth daily.    . Prenat-FeFmCb-DSS-FA-DHA w/o A (CITRANATAL HARMONY) 27-1-260 MG CAPS Take 1 capsule by mouth daily before breakfast. 30 capsule 11  . fluconazole (DIFLUCAN) 150 MG tablet Take 1 tablet (150 mg total) by mouth once. (Patient not taking: Reported on 09/01/2014) 1 tablet 2   No current facility-administered medications for this visit.    Review of Systems Review of Systems Constitutional: negative for fatigue and weight loss Respiratory: negative for cough and  wheezing Cardiovascular: negative for chest pain, fatigue and palpitations Gastrointestinal: negative for abdominal pain and change in bowel habits Genitourinary:negative Integument/breast: negative for nipple discharge Musculoskeletal:negative for myalgias Neurological: negative for gait problems and tremors Behavioral/Psych: negative for abusive relationship, depression Endocrine: negative for temperature intolerance     Blood pressure 146/90, pulse 56, temperature 98.3 F (36.8 C), weight 182 lb 3.2 oz (82.645 kg), last menstrual period 12/20/2014.  Physical Exam Physical Exam           General:  Alert and no distress Abdomen:  normal findings: no organomegaly, soft, non-tender and no hernia  Pelvis:  External genitalia: normal general appearance Urinary system: urethral meatus normal and bladder without fullness, nontender Vaginal: normal without tenderness, induration or masses Cervix: normal appearance.  Pap smear and wet prep done with cultures       Data Reviewed Pap  Assessment     ASCUS with positive High Risk HPV     Plan    Repeat pap in one year if this pap is normal.   No orders of the defined types were placed in this encounter.   No orders of the defined types were placed in this encounter.

## 2015-01-06 NOTE — Addendum Note (Signed)
Addended by: Baltazar Najjar A on: 01/06/2015 05:05 PM   Modules accepted: Orders

## 2015-01-07 LAB — PAP IG AND HPV HIGH-RISK: HPV DNA High Risk: NOT DETECTED

## 2015-01-09 LAB — SURESWAB, VAGINOSIS/VAGINITIS PLUS
Atopobium vaginae: NOT DETECTED Log (cells/mL)
C. albicans, DNA: NOT DETECTED
C. glabrata, DNA: NOT DETECTED
C. parapsilosis, DNA: NOT DETECTED
C. trachomatis RNA, TMA: NOT DETECTED
C. tropicalis, DNA: NOT DETECTED
Gardnerella vaginalis: <4.7 Log (cells/mL)
LACTOBACILLUS SPECIES: 6.7 Log (cells/mL)
MEGASPHAERA SPECIES: NOT DETECTED Log (cells/mL)
N. gonorrhoeae RNA, TMA: NOT DETECTED
T. vaginalis RNA, QL TMA: NOT DETECTED

## 2015-01-27 ENCOUNTER — Ambulatory Visit (INDEPENDENT_AMBULATORY_CARE_PROVIDER_SITE_OTHER): Payer: Medicaid Other | Admitting: Certified Nurse Midwife

## 2015-01-27 ENCOUNTER — Encounter: Payer: Self-pay | Admitting: Certified Nurse Midwife

## 2015-01-27 VITALS — BP 120/83 | HR 62 | Temp 98.6°F | Wt 179.6 lb

## 2015-01-27 DIAGNOSIS — N921 Excessive and frequent menstruation with irregular cycle: Secondary | ICD-10-CM | POA: Diagnosis not present

## 2015-01-27 DIAGNOSIS — D259 Leiomyoma of uterus, unspecified: Secondary | ICD-10-CM | POA: Insufficient documentation

## 2015-01-27 DIAGNOSIS — L680 Hirsutism: Secondary | ICD-10-CM

## 2015-01-27 DIAGNOSIS — D219 Benign neoplasm of connective and other soft tissue, unspecified: Secondary | ICD-10-CM | POA: Insufficient documentation

## 2015-01-27 LAB — CBC WITH DIFFERENTIAL/PLATELET
Basophils Absolute: 0 10*3/uL (ref 0.0–0.1)
Basophils Relative: 0 % (ref 0–1)
Eosinophils Absolute: 0 10*3/uL (ref 0.0–0.7)
Eosinophils Relative: 0 % (ref 0–5)
HCT: 38.4 % (ref 36.0–46.0)
Hemoglobin: 12.8 g/dL (ref 12.0–15.0)
Lymphocytes Relative: 22 % (ref 12–46)
Lymphs Abs: 1.7 10*3/uL (ref 0.7–4.0)
MCH: 29.8 pg (ref 26.0–34.0)
MCHC: 33.3 g/dL (ref 30.0–36.0)
MCV: 89.5 fL (ref 78.0–100.0)
MPV: 10.4 fL (ref 8.6–12.4)
Monocytes Absolute: 0.8 10*3/uL (ref 0.1–1.0)
Monocytes Relative: 11 % (ref 3–12)
Neutro Abs: 5.1 10*3/uL (ref 1.7–7.7)
Neutrophils Relative %: 67 % (ref 43–77)
Platelets: 304 10*3/uL (ref 150–400)
RBC: 4.29 MIL/uL (ref 3.87–5.11)
RDW: 14 % (ref 11.5–15.5)
WBC: 7.6 10*3/uL (ref 4.0–10.5)

## 2015-01-27 LAB — HDL CHOLESTEROL: HDL: 50 mg/dL (ref >=46)

## 2015-01-27 LAB — COMPREHENSIVE METABOLIC PANEL
ALT: 13 U/L (ref 6–29)
AST: 16 U/L (ref 10–30)
Albumin: 4.1 g/dL (ref 3.6–5.1)
Alkaline Phosphatase: 68 U/L (ref 33–115)
BUN: 6 mg/dL — ABNORMAL LOW (ref 7–25)
CO2: 31 mmol/L (ref 20–31)
Calcium: 9.6 mg/dL (ref 8.6–10.2)
Chloride: 103 mmol/L (ref 98–110)
Creat: 0.82 mg/dL (ref 0.50–1.10)
Glucose, Bld: 83 mg/dL (ref 65–99)
Potassium: 4.1 mmol/L (ref 3.5–5.3)
Sodium: 138 mmol/L (ref 135–146)
Total Bilirubin: 0.4 mg/dL (ref 0.2–1.2)
Total Protein: 7.3 g/dL (ref 6.1–8.1)

## 2015-01-27 LAB — TRIGLYCERIDES: Triglycerides: 60 mg/dL (ref <150)

## 2015-01-27 LAB — POCT URINE PREGNANCY: Preg Test, Ur: NEGATIVE

## 2015-01-27 LAB — CHOLESTEROL, TOTAL: Cholesterol: 135 mg/dL (ref 125–200)

## 2015-01-27 NOTE — Progress Notes (Signed)
Patient ID: [name], female   DOB: 1976/01/31, 39 y.o.   MRN: [id number]   Chief Complaint  Patient presents with  . Menstrual Problem    abnormal bleeding     HPI [name] is a 39 y.o. female.  Here with complaints of spotting after her period ended two weeks ago.  Is currently sexually active and not using any form of birth control for the last 3 years.  Reports regular periods.   States that this has not happened before.  Recent US in April showed multiple fibroids.   HPI  Past Medical History  Diagnosis Date  . Hypertension   . Anxiety   . Depressed   . Strep pharyngitis   . Heart murmur   . Bronchitis     Past Surgical History  Procedure Laterality Date  . No past surgeries    . Oral surgery      Family History  Problem Relation Age of Onset  . Heart disease Mother   . Asthma    . Bronchitis Mother   . Hypertension      Social History Social History  Substance Use Topics  . Smoking status: Never Smoker   . Smokeless tobacco: Never Used  . Alcohol Use: No    No Known Allergies  Current Outpatient Prescriptions  Medication Sig Dispense Refill  . albuterol (PROVENTIL HFA;VENTOLIN HFA) 108 (90 BASE) MCG/ACT inhaler Inhale 1-2 puffs into the lungs every 6 (six) hours as needed for wheezing or shortness of breath.    . cholecalciferol (VITAMIN D) 1000 UNITS tablet Take 1,000 Units by mouth daily.    . clonazePAM (KLONOPIN) 0.5 MG tablet Take 0.5 mg by mouth 2 (two) times daily as needed for anxiety.    . folic acid (FOLVITE) 1 MG tablet Take 1 mg by mouth daily.    Marland Kitchen ibuprofen (ADVIL,MOTRIN) 800 MG tablet Take 800 mg by mouth every 8 (eight) hours as needed for mild pain.    Marland Kitchen lisinopril (PRINIVIL,ZESTRIL) 10 MG tablet Take 10 mg by mouth daily.    . Prenat-FeFmCb-DSS-FA-DHA w/o A (CITRANATAL HARMONY) 27-1-260 MG CAPS Take 1 capsule by mouth daily before breakfast. 30 capsule 11  . vitamin B-12 (CYANOCOBALAMIN) 100 MCG tablet Take 100 mcg by mouth  daily.    . fluconazole (DIFLUCAN) 150 MG tablet Take 1 tablet (150 mg total) by mouth once. (Patient not taking: Reported on 09/01/2014) 1 tablet 2   No current facility-administered medications for this visit.    Review of Systems Review of Systems Constitutional: negative for fatigue and weight loss Respiratory: negative for cough and wheezing Cardiovascular: negative for chest pain, fatigue and palpitations Gastrointestinal: negative for abdominal pain and change in bowel habits Genitourinary: + vaginal spotting Integument/breast: negative for nipple discharge Musculoskeletal:negative for myalgias Neurological: negative for gait problems and tremors Behavioral/Psych: negative for abusive relationship, depression Endocrine: negative for temperature intolerance     Blood pressure 120/83, pulse 62, temperature 98.6 F (37 C), weight 179 lb 9.6 oz (81.466 kg), last menstrual period 01/14/2015.  Physical Exam Physical Exam General:   alert  Skin:   no rash or abnormalities, + hirsutism  Lungs:   clear to auscultation bilaterally  Heart:   regular rate and rhythm, S1, S2 normal, no murmur, click, rub or gallop  Breasts:   deferred  Abdomen:  normal findings: no organomegaly, soft, non-tender and no hernia  Pelvis:  External genitalia: normal general appearance Urinary system: urethral meatus normal and bladder without fullness, nontender  Vaginal: normal without tenderness, induration or masses Cervix: normal appearance Adnexa: normal bimanual exam Uterus: retroverted and non-tender, normal size    50% of 15 min visit spent on counseling and coordination of care.   Data Reviewed Previous medical hx, labs, meds, ultrasound  Assessment     Multiple uterine fibroids R/O pregnancy  Preconception counseling    Plan    Orders Placed This Encounter  Procedures  . CBC with Differential/Platelet  . Comprehensive metabolic panel  . TSH  . Prolactin  . Testosterone, Free,  Total, SHBG  . 17-Hydroxyprogesterone  . Progesterone  . Cholesterol, total  . Triglycerides  . HDL cholesterol  . Hemoglobin A1c  . hCG, quantitative, pregnancy  . POCT urine pregnancy   Meds ordered this encounter  Medications  . folic acid (FOLVITE) 1 MG tablet    Sig: Take 1 mg by mouth daily.  . vitamin B-12 (CYANOCOBALAMIN) 100 MCG tablet    Sig: Take 100 mcg by mouth daily.     Possible management options include:  Birth control to control her periods.   Follow up as needed.

## 2015-01-28 LAB — TSH: TSH: 0.834 u[IU]/mL (ref 0.350–4.500)

## 2015-01-28 LAB — TESTOSTERONE, FREE, TOTAL, SHBG
Sex Hormone Binding: 21 nmol/L (ref 17–124)
Testosterone, Free: 11.9 pg/mL — ABNORMAL HIGH (ref 0.6–6.8)
Testosterone-% Free: 2.3 % (ref 0.4–2.4)
Testosterone: 52 ng/dL (ref 10–70)

## 2015-01-28 LAB — HEMOGLOBIN A1C
Hgb A1c MFr Bld: 5.5 % (ref <5.7)
Mean Plasma Glucose: 111 mg/dL (ref <117)

## 2015-01-28 LAB — PROGESTERONE: Progesterone: 0.9 ng/mL

## 2015-01-28 LAB — PROLACTIN: Prolactin: 13 ng/mL

## 2015-01-28 LAB — HCG, QUANTITATIVE, PREGNANCY: hCG, Beta Chain, Quant, S: <2 m[IU]/mL

## 2015-01-31 LAB — 17-HYDROXYPROGESTERONE: 17-OH-Progesterone, LC/MS/MS: 157 ng/dL

## 2015-03-03 ENCOUNTER — Ambulatory Visit: Payer: Medicaid Other | Admitting: Obstetrics

## 2015-04-11 ENCOUNTER — Encounter (HOSPITAL_COMMUNITY): Payer: Self-pay

## 2015-04-11 DIAGNOSIS — Z79899 Other long term (current) drug therapy: Secondary | ICD-10-CM | POA: Insufficient documentation

## 2015-04-11 DIAGNOSIS — R42 Dizziness and giddiness: Secondary | ICD-10-CM | POA: Diagnosis present

## 2015-04-11 DIAGNOSIS — R011 Cardiac murmur, unspecified: Secondary | ICD-10-CM | POA: Diagnosis not present

## 2015-04-11 DIAGNOSIS — F419 Anxiety disorder, unspecified: Secondary | ICD-10-CM | POA: Insufficient documentation

## 2015-04-11 DIAGNOSIS — F439 Reaction to severe stress, unspecified: Secondary | ICD-10-CM | POA: Diagnosis not present

## 2015-04-11 DIAGNOSIS — I10 Essential (primary) hypertension: Secondary | ICD-10-CM | POA: Diagnosis not present

## 2015-04-11 NOTE — ED Notes (Signed)
Per [facility], pt picked up from bus stop by EMS for anxiety and HTN. Her best friend is currently with her. Pt has hx of HTN but doesn't take medication like she should. 180/110. Requested EMS do an EKG on her because someone told her stress and anxiety can lead to a heart attack.

## 2015-04-12 ENCOUNTER — Emergency Department (HOSPITAL_COMMUNITY)
Admission: EM | Admit: 2015-04-12 | Discharge: 2015-04-12 | Disposition: A | Discharge status: Home / Self Care | Payer: Medicaid Other | Attending: Emergency Medicine | Admitting: Emergency Medicine

## 2015-04-12 DIAGNOSIS — I10 Essential (primary) hypertension: Secondary | ICD-10-CM

## 2015-04-12 DIAGNOSIS — F439 Reaction to severe stress, unspecified: Secondary | ICD-10-CM

## 2015-04-12 LAB — I-STAT CHEM 8, ED
BUN: 5 mg/dL — ABNORMAL LOW (ref 6–20)
Calcium, Ion: 1.13 mmol/L (ref 1.12–1.23)
Chloride: 101 mmol/L (ref 101–111)
Creatinine, Ser: 0.8 mg/dL (ref 0.44–1.00)
Glucose, Bld: 85 mg/dL (ref 65–99)
HCT: 37 % (ref 36.0–46.0)
Hemoglobin: 12.6 g/dL (ref 12.0–15.0)
Potassium: 3.3 mmol/L — ABNORMAL LOW (ref 3.5–5.1)
Sodium: 140 mmol/L (ref 135–145)
TCO2: 25 mmol/L (ref 0–100)

## 2015-04-12 MED ORDER — ACETAMINOPHEN 500 MG PO TABS
1000.0000 mg | ORAL_TABLET | Freq: Once | ORAL | Status: AC
  Administered 2015-04-12: 1000 mg via ORAL
  Filled 2015-04-12: qty 2

## 2015-04-12 NOTE — ED Provider Notes (Signed)
CSN: [id number]   Arrival date & time 04/11/15 2128  History  By signing my name below, I, [name], attest that this documentation has been prepared under the direction and in the presence of Merryl Hacker, MD. Electronically Signed: [name], ED Scribe. 04/12/2015. 3:17 AM.  Chief Complaint  Patient presents with  . Anxiety    HPI The history is provided by the patient. No language interpreter was used.   Brought in by EMS, [name] is a 39 y.o. female with history of anxiety, depression, and HTN who presents to the Emergency Department complaining of lightheadedness with onset yesterday afternoon on the bus. Pt states that she felt "faint and fuzzy" with bilateral hand tingling and was evaluated by EMS who told her that she was anxious and that her blood pressure was elevated. Associated symptoms include tingling at the fingertips during the episode and an 8/10 headache that developed later in the day. She has somewhat frequent headaches that are improved with Motrin. Pt notes being under increased stress recently. Denies weakness, gait difficulty, abdominal pain, and any other symptoms. NKDA.   Past Medical History  Diagnosis Date  . Hypertension   . Anxiety   . Depressed   . Strep pharyngitis   . Heart murmur   . Bronchitis     Past Surgical History  Procedure Laterality Date  . No past surgeries    . Oral surgery      Family History  Problem Relation Age of Onset  . Heart disease Mother   . Asthma    . Bronchitis Mother   . Hypertension      Social History  Substance Use Topics  . Smoking status: Never Smoker   . Smokeless tobacco: Never Used  . Alcohol Use: No     Review of Systems  Constitutional: Negative for fever.  Respiratory: Negative for shortness of breath.   Cardiovascular: Negative for chest pain.  Gastrointestinal: Negative for nausea, vomiting and diarrhea.  Neurological: Positive for light-headedness and headaches.   Tingling at the fingertips  Psychiatric/Behavioral: The patient is nervous/anxious.   All other systems reviewed and are negative.  Home Medications   Prior to Admission medications   Medication Sig Start Date End Date Taking? Authorizing Provider  albuterol (PROVENTIL HFA;VENTOLIN HFA) 108 (90 BASE) MCG/ACT inhaler Inhale 1-2 puffs into the lungs every 6 (six) hours as needed for wheezing or shortness of breath.    Historical Provider, MD  cholecalciferol (VITAMIN D) 1000 UNITS tablet Take 1,000 Units by mouth daily.    Historical Provider, MD  clonazePAM (KLONOPIN) 0.5 MG tablet Take 0.5 mg by mouth 2 (two) times daily as needed for anxiety.    Historical Provider, MD  fluconazole (DIFLUCAN) 150 MG tablet Take 1 tablet (150 mg total) by mouth once. Patient not taking: Reported on 09/01/2014 08/16/14   Shelly Bombard, MD  folic acid (FOLVITE) 1 MG tablet Take 1 mg by mouth daily.    Historical Provider, MD  ibuprofen (ADVIL,MOTRIN) 800 MG tablet Take 800 mg by mouth every 8 (eight) hours as needed for mild pain.    Historical Provider, MD  lisinopril (PRINIVIL,ZESTRIL) 10 MG tablet Take 10 mg by mouth daily.    Historical Provider, MD  Prenat-FeFmCb-DSS-FA-DHA w/o A (CITRANATAL HARMONY) 27-1-260 MG CAPS Take 1 capsule by mouth daily before breakfast. 09/01/14   Shelly Bombard, MD  vitamin B-12 (CYANOCOBALAMIN) 100 MCG tablet Take 100 mcg by mouth daily.    Historical Provider, MD  Allergies  Review of patient's allergies indicates no known allergies.  Triage Vitals: BP 154/102 mmHg  Pulse 71  Temp(Src) 98.1 F (36.7 C) (Oral)  Resp 16  Ht 5\' 5"  (1.651 m)  Wt 180 lb (81.647 kg)  BMI 29.95 kg/m2  SpO2 100%  LMP 03/28/2015  Physical Exam  Constitutional: She is oriented to person, place, and time. She appears well-developed and well-nourished. No distress.  HENT:  Head: Normocephalic and atraumatic.  Neck: Normal range of motion.  Cardiovascular: Normal rate, regular rhythm and  normal heart sounds.   No murmur heard. Pulmonary/Chest: Effort normal and breath sounds normal. No respiratory distress. She has no wheezes.  Abdominal: Soft. Bowel sounds are normal. There is no tenderness. There is no rebound.  Musculoskeletal: She exhibits no edema.  Neurological: She is alert and oriented to person, place, and time.  5 out of 5 strength in all 4 fremitus, cranial nerves II through XII intact, normal gait  Skin: Skin is warm and dry.  Psychiatric: She has a normal mood and affect.  Nursing note and vitals reviewed.   ED Course  Procedures   DIAGNOSTIC STUDIES: Oxygen Saturation is 100% on RA, normal by my interpretation.    COORDINATION OF CARE: 3:10 AM Discussed treatment plan which includes lab work, EKG, and Tylenol with pt at bedside and pt agreed to plan.  Labs Reviewed  I-STAT CHEM 8, ED - Abnormal; Notable for the following:    Potassium 3.3 (*)    BUN 5 (*)    All other components within normal limits    Imaging Review No results found.  I personally reviewed and evaluated these lab results as a part of my medical decision-making.   EKG Interpretation  Date/Time:  Tuesday April 12 2015 03:41:38 EST Ventricular Rate:  65 PR Interval:  168 QRS Duration: 79 QT Interval:  412 QTC Calculation: 428 R Axis:   77 Text Interpretation:  Sinus rhythm Probable left atrial enlargement Confirmed by [name]  MD, [place] ([id number]) on 04/12/2015 3:43:42 AM    MDM   Final diagnoses:  Essential hypertension  Stress    Patient presents with concerns for anxiety and hypertension. She developed a headache while on the rating room. Reports frequent headaches. Otherwise nontoxic. Nonfocal. Afebrile and vital signs largely reassuring. Blood pressure is 156/96. She has not taken her blood pressure medications today. Basic labwork obtained and largely reassuring. EKG is also reassuring. Patient was given Tylenol for her headache. She declined Toradol. Low  suspicion at this time for hypertensive urgency. Suspect patient may have had some anxiety prior to arrival causing paresthesias. She is otherwise back to baseline with the exception of her headache.  After history, exam, and medical workup I feel the patient has been appropriately medically screened and is safe for discharge home. Pertinent diagnoses were discussed with the patient. Patient was given return precautions.  I personally performed the services described in this documentation, which was scribed in my presence. The recorded information has been reviewed and is accurate.    Merryl Hacker, MD 04/12/15 [phone]

## 2015-04-12 NOTE — Discharge Instructions (Signed)
You were seen today for anxiety and hypertension. Your also noted to have a headache. Your workup is reassuring. You need to make sure to take her blood pressure medications daily. Tylenol or ibuprofen for headache. See return precautions below.  Hypertension Hypertension, commonly called high blood pressure, is when the force of blood pumping through your arteries is too strong. Your arteries are the blood vessels that carry blood from your heart throughout your body. A blood pressure reading consists of a higher number over a lower number, such as 110/72. The higher number (systolic) is the pressure inside your arteries when your heart pumps. The lower number (diastolic) is the pressure inside your arteries when your heart relaxes. Ideally you want your blood pressure below 120/80. Hypertension forces your heart to work harder to pump blood. Your arteries may become narrow or stiff. Having untreated or uncontrolled hypertension can cause heart attack, stroke, kidney disease, and other problems. RISK FACTORS Some risk factors for high blood pressure are controllable. Others are not.  Risk factors you cannot control include:   Race. You may be at higher risk if you are African American.  Age. Risk increases with age.  Gender. Men are at higher risk than women before age 68 years. After age 58, women are at higher risk than men. Risk factors you can control include:  Not getting enough exercise or physical activity.  Being overweight.  Getting too much fat, sugar, calories, or salt in your diet.  Drinking too much alcohol. SIGNS AND SYMPTOMS Hypertension does not usually cause signs or symptoms. Extremely high blood pressure (hypertensive crisis) may cause headache, anxiety, shortness of breath, and nosebleed. DIAGNOSIS To check if you have hypertension, your health care provider will measure your blood pressure while you are seated, with your arm held at the level of your heart. It should  be measured at least twice using the same arm. Certain conditions can cause a difference in blood pressure between your right and left arms. A blood pressure reading that is higher than normal on one occasion does not mean that you need treatment. If it is not clear whether you have high blood pressure, you may be asked to return on a different day to have your blood pressure checked again. Or, you may be asked to monitor your blood pressure at home for 1 or more weeks. TREATMENT Treating high blood pressure includes making lifestyle changes and possibly taking medicine. Living a healthy lifestyle can help lower high blood pressure. You may need to change some of your habits. Lifestyle changes may include:  Following the DASH diet. This diet is high in fruits, vegetables, and whole grains. It is low in salt, red meat, and added sugars.  Keep your sodium intake below 2,300 mg per day.  Getting at least 30-45 minutes of aerobic exercise at least 4 times per week.  Losing weight if necessary.  Not smoking.  Limiting alcoholic beverages.  Learning ways to reduce stress. Your health care provider may prescribe medicine if lifestyle changes are not enough to get your blood pressure under control, and if one of the following is true:  You are 68-82 years of age and your systolic blood pressure is above 140.  You are 2 years of age or older, and your systolic blood pressure is above 150.  Your diastolic blood pressure is above 90.  You have diabetes, and your systolic blood pressure is over [id number] or your diastolic blood pressure is over 90.  You have  kidney disease and your blood pressure is above 140/90.  You have heart disease and your blood pressure is above 140/90. Your personal target blood pressure may vary depending on your medical conditions, your age, and other factors. HOME CARE INSTRUCTIONS  Have your blood pressure rechecked as directed by your health care provider.   Take  medicines only as directed by your health care provider. Follow the directions carefully. Blood pressure medicines must be taken as prescribed. The medicine does not work as well when you skip doses. Skipping doses also puts you at risk for problems.  Do not smoke.   Monitor your blood pressure at home as directed by your health care provider. SEEK MEDICAL CARE IF:   You think you are having a reaction to medicines taken.  You have recurrent headaches or feel dizzy.  You have swelling in your ankles.  You have trouble with your vision. SEEK IMMEDIATE MEDICAL CARE IF:  You develop a severe headache or confusion.  You have unusual weakness, numbness, or feel faint.  You have severe chest or abdominal pain.  You vomit repeatedly.  You have trouble breathing. MAKE SURE YOU:   Understand these instructions.  Will watch your condition.  Will get help right away if you are not doing well or get worse.   This information is not intended to replace advice given to you by your health care provider. Make sure you discuss any questions you have with your health care provider.   Document Released: 04/30/2005 Document Revised: 09/14/2014 Document Reviewed: 02/20/2013 Elsevier Interactive Patient Education [employer].

## 2015-04-18 ENCOUNTER — Ambulatory Visit (INDEPENDENT_AMBULATORY_CARE_PROVIDER_SITE_OTHER): Payer: Medicaid Other | Admitting: Podiatry

## 2015-04-18 ENCOUNTER — Ambulatory Visit (INDEPENDENT_AMBULATORY_CARE_PROVIDER_SITE_OTHER): Payer: Medicaid Other

## 2015-04-18 ENCOUNTER — Encounter: Payer: Self-pay | Admitting: Podiatry

## 2015-04-18 VITALS — BP 114/60 | HR 63 | Resp 18

## 2015-04-18 DIAGNOSIS — M7750 Other enthesopathy of unspecified foot: Secondary | ICD-10-CM | POA: Diagnosis not present

## 2015-04-18 DIAGNOSIS — R52 Pain, unspecified: Secondary | ICD-10-CM

## 2015-04-18 DIAGNOSIS — M779 Enthesopathy, unspecified: Secondary | ICD-10-CM

## 2015-04-18 DIAGNOSIS — M19071 Primary osteoarthritis, right ankle and foot: Secondary | ICD-10-CM

## 2015-04-18 DIAGNOSIS — Q6689 Other  specified congenital deformities of feet: Secondary | ICD-10-CM

## 2015-04-18 NOTE — Progress Notes (Signed)
   Subjective:    Patient ID: [name], female    DOB: 03/08/1976, 39 y.o.   MRN: [id number]  HPI  39 year old female presents the office they with concerns of pain to the top of her right foot which has been ongoing for last couple of days. She states that she did have the same pain several months ago which lasted approximate 5 days before resolved. She denies any recent injury or trauma. She denies any swelling or redness. She says there is painful to walk on and pressure after long periods of time. Denies any numbness or tingling. The area does throb. The pain does not wake her up at night. No other complaints at this time.   Review of Systems  All other systems reviewed and are negative.      Objective:   Physical Exam General: AAO x3, NAD  Dermatological: Skin is warm, dry and supple bilateral. Nails x 10 are well manicured; remaining integument appears unremarkable at this time. There are no open sores, no preulcerative lesions, no rash or signs of infection present.  Vascular: Dorsalis Pedis artery and Posterior Tibial artery pedal pulses are 2/4 bilateral with immedate capillary fill time. Pedal hair growth present. No varicosities and no lower extremity edema present bilateral. There is no pain with calf compression, swelling, warmth, erythema.   Neruologic: Grossly intact via light touch bilateral. Vibratory intact via tuning fork bilateral. Protective threshold with Semmes Wienstein monofilament intact to all pedal sites bilateral. Patellar and Achilles deep tendon reflexes 2+ bilateral. No Babinski or clonus noted bilateral.   Musculoskeletal: There is tenderness palpation overlying the sinus tarsi mostly on the right foot. There is decreased range of motion of the subtalar joint however ankle joint range of motion is intact. There is decreased midtarsal range of motion. MPJ range of motion is intact. The foot appears to be a varus spastic state. There is no specific area  pinpoint bony tenderness or pain the vibratory sensation otherwise. There is no overlying edema, erythema, increase in warmth.  Gait: Unassisted, Nonantalgic.      Assessment & Plan:  39 year old female with right foot pain, spasm, capsulitis -X-rays were obtained and reviewed with the patient. 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 -I discussed steroid injection of the subtalar joint as this appears to be with the majority of her symptoms are localized. I discussed risks, complications for which she understands verbally consents. Under sterile conditions a total of 1.5 mL of a mixture of Dexon present phosphate and 2% lidocaine plain was infiltrated into the sinus tarsi without, complications. Post injection care was discussed. -Given the amount of arthritis she has been refitted pulses spasm Will order MRI to evaluate the integrity of the rear foot joints as well as to evaluate for possible coalition. -Ice and elevation -Anti-inflammatories as needed. Continue ibuprofen 800 mg. -Follow-up after MRI or sooner if any problems arise. In the meantime, encouraged to call the office with any questions, concerns, change in symptoms.   Annice Needy, DPM

## 2015-04-19 ENCOUNTER — Telehealth: Payer: Self-pay | Admitting: *Deleted

## 2015-04-19 DIAGNOSIS — R52 Pain, unspecified: Secondary | ICD-10-CM

## 2015-04-19 NOTE — Telephone Encounter (Addendum)
Dr. Jacqualyn Posey ordered MRI right foot [phone], r/o foot coalition.  orders and pt data faxed.  04/27/2015 -  [employer] prior authorization is still pending.  Informed [facility] and Pt.  [employer] PRIOR AUTHORIZATION MRI RIGHT FOOT [id number], VALID 04/21/2015 - 01/072017.  Informed pt and faxed to [facility].

## 2015-04-21 NOTE — Telephone Encounter (Signed)
Pre-certification was started with [employer]. Additional clinical notes were requested. Will wait for processing and will proceed with scheduling if approved.

## 2015-04-27 ENCOUNTER — Inpatient Hospital Stay: Admission: RE | Admit: 2015-04-27 | Payer: Medicaid Other | Source: Ambulatory Visit

## 2015-04-27 ENCOUNTER — Telehealth: Payer: Self-pay | Admitting: Podiatry

## 2015-04-27 NOTE — Telephone Encounter (Signed)
Pt is scheduled for mri today at 1 and the imaging center called pt and told her they have not gotten authorization yet. Please call pt with an update

## 2015-05-12 ENCOUNTER — Inpatient Hospital Stay: Admission: RE | Admit: 2015-05-12 | Payer: Medicaid Other | Source: Ambulatory Visit

## 2015-05-17 ENCOUNTER — Ambulatory Visit
Admission: RE | Admit: 2015-05-17 | Discharge: 2015-05-17 | Disposition: A | Discharge status: Home / Self Care | Payer: Medicaid Other | Source: Ambulatory Visit | Attending: Podiatry | Admitting: Podiatry

## 2015-05-17 DIAGNOSIS — R52 Pain, unspecified: Secondary | ICD-10-CM

## 2015-05-24 ENCOUNTER — Encounter: Payer: Self-pay | Admitting: Podiatry

## 2015-05-24 ENCOUNTER — Ambulatory Visit (INDEPENDENT_AMBULATORY_CARE_PROVIDER_SITE_OTHER): Payer: Medicaid Other | Admitting: Podiatry

## 2015-05-24 VITALS — BP 104/62 | HR 70 | Resp 16

## 2015-05-24 DIAGNOSIS — M19079 Primary osteoarthritis, unspecified ankle and foot: Secondary | ICD-10-CM | POA: Insufficient documentation

## 2015-05-24 DIAGNOSIS — Q6689 Other  specified congenital deformities of feet: Secondary | ICD-10-CM | POA: Diagnosis not present

## 2015-05-24 DIAGNOSIS — M19071 Primary osteoarthritis, right ankle and foot: Secondary | ICD-10-CM

## 2015-05-24 NOTE — Progress Notes (Signed)
Patient ID: [name], female   DOB: 01/26/1976, 40 y.o.   MRN: [id number]  Subjective:  40 year old female presents the office they for follow-up evaluation of right foot pain discussed MRI results. She states that she does not have much pain in her right foot since last appointment she is only had 1 day of discomfort. She denies any recent injury or trauma. She denies any tingling or numbness. Overall she feels that she has improved. Denies any systemic complaints such as fevers, chills, nausea, vomiting. No acute changes since last appointment, and no other complaints at this time.   Objective: AAO x3, NAD DP/PT pulses palpable bilaterally, CRT less than 3 seconds Protective sensation intact with Simms Weinstein monofilament There is tenderness palpation along the lateral aspect of the sinus tarsi with a decreased range of motion of the subtalar joint. There is also mild discomfort along talonavicular joint and decreased range of motion of the midtarsal joints. No other areas of tenderness to bilateral lower extremities. No areas of pinpoint bony tenderness or pain with vibratory sensation. MMT 5/5, ROM WNL. No edema, erythema, increase in warmth to bilateral lower extremities.  No open lesions or pre-ulcerative lesions.  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  -MRI findings discussed. See below -I discussed both conservative and surgical treatment options. As her pain is decreased and she only had intermittent pain at discussed with her brace. I will have her follow up with Benjie Karvonen, certified pedorthotist, for bracing. Would she be good for a [place] brace? -If in the future her symptoms continue may need surgical intervention but will hold off  for now. -Patient encouraged to call the office with any questions, concerns, change in symptoms.   Celesta Gentile, DPM

## 2015-06-20 ENCOUNTER — Other Ambulatory Visit: Payer: Medicaid Other

## 2015-07-21 ENCOUNTER — Ambulatory Visit: Payer: Medicaid Other | Admitting: *Deleted

## 2015-07-21 DIAGNOSIS — R52 Pain, unspecified: Secondary | ICD-10-CM

## 2015-07-21 NOTE — Progress Notes (Signed)
Patient ID: [name], female   DOB: 12/06/1975, 40 y.o.   MRN: [id number] Patient presents to be casted for a brace with [facility] Certified Pedorthist.  Patient will return in 4 weeks to be fitted.

## 2015-08-15 ENCOUNTER — Ambulatory Visit: Payer: Medicaid Other | Admitting: *Deleted

## 2015-08-15 DIAGNOSIS — M779 Enthesopathy, unspecified: Secondary | ICD-10-CM

## 2015-08-15 NOTE — Progress Notes (Signed)
Patient ID: [name], female   DOB: 1976-04-14, 40 y.o.   MRN: [id number] Patient presents for fitting of [place] with [facility] Certified Pedorthist. Written and verbal break in instructions given. Patient will follow up in 6 weeks with Dr. Jacqualyn Posey.

## 2015-09-14 ENCOUNTER — Other Ambulatory Visit: Payer: Self-pay | Admitting: Physician Assistant

## 2015-09-14 DIAGNOSIS — R102 Pelvic and perineal pain: Secondary | ICD-10-CM

## 2015-09-20 ENCOUNTER — Other Ambulatory Visit: Payer: Medicaid Other

## 2015-09-26 ENCOUNTER — Ambulatory Visit: Payer: Medicaid Other | Admitting: Podiatry

## 2015-10-12 ENCOUNTER — Ambulatory Visit: Payer: Medicaid Other | Admitting: Podiatry

## 2015-10-31 ENCOUNTER — Ambulatory Visit: Payer: Medicaid Other | Admitting: Podiatry

## 2015-11-25 ENCOUNTER — Ambulatory Visit: Payer: Medicaid Other | Admitting: Podiatry

## 2015-12-12 ENCOUNTER — Encounter: Payer: Self-pay | Admitting: Podiatry

## 2015-12-12 ENCOUNTER — Ambulatory Visit (INDEPENDENT_AMBULATORY_CARE_PROVIDER_SITE_OTHER): Payer: Medicaid Other | Admitting: Podiatry

## 2015-12-12 DIAGNOSIS — Q6689 Other  specified congenital deformities of feet: Secondary | ICD-10-CM

## 2015-12-12 DIAGNOSIS — M19071 Primary osteoarthritis, right ankle and foot: Secondary | ICD-10-CM

## 2015-12-18 NOTE — Progress Notes (Signed)
Subjective: 40 year old female presents the office today 6 weeks after getting [place] brace. She states that when she wears a brace does help but she does not like to wear. She denies any acute changes since last appointment no other concerns  Denies any systemic complaints such as fevers, chills, nausea, vomiting. No acute changes since last appointment, and no other complaints at this time.   Objective: AAO x3, NAD DP/PT pulses palpable bilaterally, CRT less than 3 seconds At this time there is no smoking tenderness on lateral aspect of the sinus tarsi how there is decreased range of motion of subtalar joint. There is also some mild discomfort on the toe navicular joint. There is no other areas of pinpoint tenderness or pain the vibratory sensation. No other areas of tenderness bilaterally. No edema, erythema, increase in warmth to bilateral lower extremities.  No open lesions or pre-ulcerative lesions.  No pain with calf compression, swelling, warmth, erythema  Assessment: Right foot congenitally fused navicular, medial cuneiform with talonavicular arthritis  Plan: -All treatment options discussed with the patient including all alternatives, risks, complications.  -I discussed both conservative and surgical treatment options. At this point after discussion she wishes to continue with conservative treatment it think about surgical options. She feels the brace hurts her foot on top. I will have her see Benjie Karvonen for possible modifications. Continue supportive shoe gear and ankle brace. Discussed possible inserts for her shoes will try to continue the ankle brace for now. She'll continue to monitor the area and ankle about her options. -Patient encouraged to call the office with any questions, concerns, change in symptoms.   Celesta Gentile, DPM

## 2015-12-19 ENCOUNTER — Other Ambulatory Visit: Payer: Medicaid Other

## 2016-01-09 ENCOUNTER — Encounter: Payer: Self-pay | Admitting: Obstetrics

## 2016-01-09 ENCOUNTER — Ambulatory Visit (INDEPENDENT_AMBULATORY_CARE_PROVIDER_SITE_OTHER): Payer: Medicaid Other | Admitting: Obstetrics

## 2016-01-09 VITALS — BP 151/92 | HR 61 | Temp 97.3°F | Wt 173.0 lb

## 2016-01-09 DIAGNOSIS — I10 Essential (primary) hypertension: Secondary | ICD-10-CM

## 2016-01-09 DIAGNOSIS — Z0000 Encounter for general adult medical examination without abnormal findings: Secondary | ICD-10-CM | POA: Diagnosis not present

## 2016-01-09 DIAGNOSIS — Z01419 Encounter for gynecological examination (general) (routine) without abnormal findings: Secondary | ICD-10-CM

## 2016-01-09 DIAGNOSIS — N943 Premenstrual tension syndrome: Secondary | ICD-10-CM

## 2016-01-09 DIAGNOSIS — D259 Leiomyoma of uterus, unspecified: Secondary | ICD-10-CM

## 2016-01-09 NOTE — Progress Notes (Signed)
Subjective:        [name] is a 40 y.o. female here for a routine exam.  Current complaints: None.    Personal health questionnaire:  Is patient Ashkenazi Jewish, have a family history of breast and/or ovarian cancer: no Is there a family history of uterine cancer diagnosed at age < 3, gastrointestinal cancer, urinary tract cancer, family member who is a Field seismologist syndrome-associated carrier: no Is the patient overweight and hypertensive, family history of diabetes, personal history of gestational diabetes, preeclampsia or PCOS: no Is patient over 66, have PCOS,  family history of premature CHD under age 93, diabetes, smoke, have hypertension or peripheral artery disease:  no At any time, has a partner hit, kicked or otherwise hurt or frightened you?: no Over the past 2 weeks, have you felt down, depressed or hopeless?: no  Over the past 2 weeks, have you felt little interest or pleasure in doing things?:no   Gynecologic History Patient's last menstrual period was 12/16/2015 (approximate). Contraception: none Last Pap: 2014. Results were: abnormal ( ASCUS with positive HPV ) Last mammogram: 2017. Results were: normal  Obstetric History OB History  Gravida Para Term Preterm AB Living  2 0     2 0  SAB TAB Ectopic Multiple Live Births  1 1          # Outcome Date GA Lbr Len/2nd Weight Sex Delivery Anes PTL Lv  2 TAB           1 SAB               Past Medical History:  Diagnosis Date  . Anxiety   . Bronchitis   . Depressed   . Heart murmur   . Hypertension   . Strep pharyngitis     Past Surgical History:  Procedure Laterality Date  . NO PAST SURGERIES    . oral surgery       Current Outpatient Prescriptions:  .  albuterol (PROVENTIL HFA;VENTOLIN HFA) 108 (90 BASE) MCG/ACT inhaler, Inhale 1-2 puffs into the lungs every 6 (six) hours as needed for wheezing or shortness of breath., Disp: , Rfl:  .  clonazePAM (KLONOPIN) 0.5 MG tablet, Take 0.5 mg by mouth 2  (two) times daily as needed for anxiety., Disp: , Rfl:  .  folic acid (FOLVITE) 1 MG tablet, Take 1 mg by mouth daily., Disp: , Rfl:  .  ibuprofen (ADVIL,MOTRIN) 800 MG tablet, Take 800 mg by mouth every 8 (eight) hours as needed for mild pain., Disp: , Rfl:  .  lisinopril (PRINIVIL,ZESTRIL) 10 MG tablet, Take 10 mg by mouth daily., Disp: , Rfl:  .  pediatric multivitamin-fluoride (POLY-VI-FLOR) 0.25 MG chewable tablet, Chew 1 tablet by mouth daily., Disp: , Rfl:  .  cholecalciferol (VITAMIN D) 1000 UNITS tablet, Take 1,000 Units by mouth daily., Disp: , Rfl:  .  vitamin B-12 (CYANOCOBALAMIN) 100 MCG tablet, Take 100 mcg by mouth daily., Disp: , Rfl:  No Known Allergies  Social History  Substance Use Topics  . Smoking status: Never Smoker  . Smokeless tobacco: Never Used  . Alcohol use No    Family History  Problem Relation Age of Onset  . Heart disease Mother   . Bronchitis Mother   . Asthma    . Hypertension    . Diabetes Brother       Review of Systems  Constitutional: negative for fatigue and weight loss Respiratory: negative for cough and wheezing Cardiovascular: negative for chest pain,  fatigue and palpitations Gastrointestinal: negative for abdominal pain and change in bowel habits Musculoskeletal:negative for myalgias Neurological: negative for gait problems and tremors Behavioral/Psych: negative for abusive relationship, depression Endocrine: negative for temperature intolerance   Genitourinary:negative for abnormal menstrual periods, genital lesions, hot flashes, sexual problems and vaginal discharge Integument/breast: negative for breast lump, breast tenderness, nipple discharge and skin lesion(s)    Objective:       BP (!) 151/92   Pulse 61   Temp 97.3 F (36.3 C)   Wt 173 lb (78.5 kg)   LMP 12/16/2015 (Approximate)   BMI 28.79 kg/m  General:   alert  Skin:   no rash or abnormalities  Lungs:   clear to auscultation bilaterally  Heart:   regular rate and  rhythm, S1, S2 normal, no murmur, click, rub or gallop  Breasts:   normal without suspicious masses, skin or nipple changes or axillary nodes  Abdomen:  normal findings: no organomegaly, soft, non-tender and no hernia  Pelvis:  External genitalia: normal general appearance Urinary system: urethral meatus normal and bladder without fullness, nontender Vaginal: normal without tenderness, induration or masses Cervix: normal appearance Adnexa: normal bimanual exam Uterus: anteverted and non-tender, normal size   Lab Review Urine pregnancy test Labs reviewed yes Radiologic studies reviewed yes  50% of 20 min visit spent on counseling and coordination of care.   Assessment:    Healthy female exam.    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  Educational material dispensed  Education reviewed: calcium supplements, depression evaluation, low fat, low cholesterol diet, safe sex/STD prevention, self breast exams and weight bearing exercise. Follow up in: 1 year.   Meds ordered this encounter  Medications  . pediatric multivitamin-fluoride (POLY-VI-FLOR) 0.25 MG chewable tablet    Sig: Chew 1 tablet by mouth daily.   No orders of the defined types were placed in this encounter.

## 2016-01-12 ENCOUNTER — Encounter: Payer: Self-pay | Admitting: Obstetrics

## 2016-01-13 LAB — PAP IG AND HPV HIGH-RISK
HPV, high-risk: NEGATIVE
PAP Smear Comment: 0

## 2016-01-17 LAB — NUSWAB VG+, CANDIDA 6SP
Candida albicans, NAA: NEGATIVE
Candida glabrata, NAA: NEGATIVE
Candida krusei, NAA: NEGATIVE
Candida lusitaniae, NAA: NEGATIVE
Candida parapsilosis, NAA: NEGATIVE
Candida tropicalis, NAA: NEGATIVE
Chlamydia trachomatis, NAA: NEGATIVE
Neisseria gonorrhoeae, NAA: NEGATIVE
Trich vag by NAA: NEGATIVE

## 2016-03-06 ENCOUNTER — Emergency Department (HOSPITAL_COMMUNITY): Payer: Medicaid Other

## 2016-03-06 ENCOUNTER — Encounter (HOSPITAL_COMMUNITY): Payer: Self-pay | Admitting: Emergency Medicine

## 2016-03-06 DIAGNOSIS — R002 Palpitations: Secondary | ICD-10-CM | POA: Diagnosis not present

## 2016-03-06 DIAGNOSIS — Z79899 Other long term (current) drug therapy: Secondary | ICD-10-CM | POA: Insufficient documentation

## 2016-03-06 DIAGNOSIS — I10 Essential (primary) hypertension: Secondary | ICD-10-CM | POA: Diagnosis not present

## 2016-03-06 DIAGNOSIS — N939 Abnormal uterine and vaginal bleeding, unspecified: Secondary | ICD-10-CM | POA: Insufficient documentation

## 2016-03-06 LAB — CBC WITH DIFFERENTIAL/PLATELET
Basophils Absolute: 0 10*3/uL (ref 0.0–0.1)
Basophils Relative: 0 %
Eosinophils Absolute: 0.1 10*3/uL (ref 0.0–0.7)
Eosinophils Relative: 1 %
HCT: 35.4 % — ABNORMAL LOW (ref 36.0–46.0)
Hemoglobin: 12 g/dL (ref 12.0–15.0)
Lymphocytes Relative: 23 %
Lymphs Abs: 2.2 10*3/uL (ref 0.7–4.0)
MCH: 29.9 pg (ref 26.0–34.0)
MCHC: 33.9 g/dL (ref 30.0–36.0)
MCV: 88.3 fL (ref 78.0–100.0)
Monocytes Absolute: 1.2 10*3/uL — ABNORMAL HIGH (ref 0.1–1.0)
Monocytes Relative: 13 %
Neutro Abs: 6 10*3/uL (ref 1.7–7.7)
Neutrophils Relative %: 63 %
Platelets: 297 10*3/uL (ref 150–400)
RBC: 4.01 MIL/uL (ref 3.87–5.11)
RDW: 13.7 % (ref 11.5–15.5)
WBC: 9.5 10*3/uL (ref 4.0–10.5)

## 2016-03-06 LAB — URINALYSIS, ROUTINE W REFLEX MICROSCOPIC
Bilirubin Urine: NEGATIVE
Glucose, UA: NEGATIVE mg/dL
Ketones, ur: NEGATIVE mg/dL
Nitrite: NEGATIVE
Protein, ur: NEGATIVE mg/dL
Specific Gravity, Urine: 1.02 (ref 1.005–1.030)
pH: 6.5 (ref 5.0–8.0)

## 2016-03-06 LAB — COMPREHENSIVE METABOLIC PANEL
ALT: 15 U/L (ref 14–54)
AST: 19 U/L (ref 15–41)
Albumin: 3.9 g/dL (ref 3.5–5.0)
Alkaline Phosphatase: 59 U/L (ref 38–126)
Anion gap: 6 (ref 5–15)
BUN: 7 mg/dL (ref 6–20)
CO2: 26 mmol/L (ref 22–32)
Calcium: 9.3 mg/dL (ref 8.9–10.3)
Chloride: 105 mmol/L (ref 101–111)
Creatinine, Ser: 0.9 mg/dL (ref 0.44–1.00)
GFR calc Af Amer: >60 mL/min (ref >60)
GFR calc non Af Amer: >60 mL/min (ref >60)
Glucose, Bld: 96 mg/dL (ref 65–99)
Potassium: 3.5 mmol/L (ref 3.5–5.1)
Sodium: 137 mmol/L (ref 135–145)
Total Bilirubin: 0.8 mg/dL (ref 0.3–1.2)
Total Protein: 7.6 g/dL (ref 6.5–8.1)

## 2016-03-06 LAB — I-STAT TROPONIN, ED: Troponin i, poc: 0 ng/mL (ref 0.00–0.08)

## 2016-03-06 LAB — URINE MICROSCOPIC-ADD ON

## 2016-03-06 LAB — POC URINE PREG, ED: Preg Test, Ur: NEGATIVE
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
## 2016-03-07 ENCOUNTER — Emergency Department (HOSPITAL_COMMUNITY)
Admission: EM | Admit: 2016-03-07 | Discharge: 2016-03-07 | Disposition: A | Discharge status: Home / Self Care | Payer: Medicaid Other | Attending: Emergency Medicine | Admitting: Emergency Medicine

## 2016-03-07 DIAGNOSIS — R002 Palpitations: Secondary | ICD-10-CM

## 2016-03-07 DIAGNOSIS — Z711 Person with feared health complaint in whom no diagnosis is made: Secondary | ICD-10-CM

## 2016-03-07 LAB — WET PREP, GENITAL
Clue Cells Wet Prep HPF POC: NONE SEEN
Sperm: NONE SEEN
Trich, Wet Prep: NONE SEEN
Yeast Wet Prep HPF POC: NONE SEEN

## 2016-03-07 LAB — GC/CHLAMYDIA PROBE AMP (~~LOC~~) NOT AT ARMC
Chlamydia: NEGATIVE
Neisseria Gonorrhea: NEGATIVE

## 2016-03-07 MED ORDER — AZITHROMYCIN 250 MG PO TABS
1000.0000 mg | ORAL_TABLET | Freq: Once | ORAL | Status: AC
  Administered 2016-03-07: 1000 mg via ORAL
  Filled 2016-03-07: qty 4

## 2016-03-07 MED ORDER — CEFTRIAXONE SODIUM 250 MG IJ SOLR
250.0000 mg | Freq: Once | INTRAMUSCULAR | Status: AC
Start: 2016-03-07 — End: 2016-03-07
  Administered 2016-03-07: 250 mg via INTRAMUSCULAR
  Filled 2016-03-07: qty 250

## 2016-03-07 NOTE — Discharge Instructions (Signed)
You were evaluated for possible STDs. You need to abstain from sexual activity for the next 10 days. Regarding her palpitations, avoid caffeine and make sure you are getting good sleep. Follow-up with your primary physician.
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
## 2016-06-15 ENCOUNTER — Other Ambulatory Visit (HOSPITAL_COMMUNITY)
Admission: RE | Admit: 2016-06-15 | Discharge: 2016-06-15 | Disposition: A | Discharge status: Home / Self Care | Payer: Medicaid Other | Source: Ambulatory Visit | Attending: Obstetrics | Admitting: Obstetrics

## 2016-06-15 ENCOUNTER — Ambulatory Visit (INDEPENDENT_AMBULATORY_CARE_PROVIDER_SITE_OTHER): Payer: Medicaid Other | Admitting: Obstetrics

## 2016-06-15 ENCOUNTER — Encounter: Payer: Self-pay | Admitting: Obstetrics

## 2016-06-15 VITALS — BP 132/86 | HR 69 | Wt 175.0 lb

## 2016-06-15 DIAGNOSIS — Z30011 Encounter for initial prescription of contraceptive pills: Secondary | ICD-10-CM | POA: Diagnosis not present

## 2016-06-15 DIAGNOSIS — R102 Pelvic and perineal pain: Secondary | ICD-10-CM

## 2016-06-15 DIAGNOSIS — Z3009 Encounter for other general counseling and advice on contraception: Secondary | ICD-10-CM

## 2016-06-15 LAB — POCT URINALYSIS DIPSTICK
Bilirubin, UA: NEGATIVE
Blood, UA: 3
Glucose, UA: NEGATIVE
Leukocytes, UA: NEGATIVE
Nitrite, UA: NEGATIVE
Protein, UA: NEGATIVE
Spec Grav, UA: 1.01
Urobilinogen, UA: NEGATIVE
pH, UA: 6.5

## 2016-06-15 LAB — POCT URINE PREGNANCY: Preg Test, Ur: NEGATIVE

## 2016-06-15 MED ORDER — LO LOESTRIN FE 1 MG-10 MCG / 10 MCG PO TABS
1.0000 | ORAL_TABLET | Freq: Every day | ORAL | 4 refills | Status: DC
Start: 2016-06-15 — End: 2021-03-16

## 2016-06-15 MED ORDER — IBUPROFEN 800 MG PO TABS: 800.0000 mg | ORAL_TABLET | Freq: Three times a day (TID) | ORAL | 5 refills | Status: DC | PRN

## 2016-06-15 NOTE — Progress Notes (Signed)
Patient is reporting a new pain in pelvic area. She has had the pain for 3 1/2 weeks. She states it started after intercourse- 3-4 days later. Patient had 1 day of mucus discharge.

## 2016-06-15 NOTE — Progress Notes (Addendum)
Patient ID: [name], female   DOB: 08/25/1975, 41 y.o.   MRN: [id number]  Chief Complaint  Patient presents with  . Gynecologic Exam    HPI [name] is a 41 y.o. female.  Pelvic pain ~ 4 days after intercourse.  Has had this pain for 3-4 weeks.  Denies dysuria, fever/chills, vaginal bleeding or discharge. HPI  Past Medical History:  Diagnosis Date  . Anxiety   . Bronchitis   . Depressed   . Heart murmur   . Hypertension   . Strep pharyngitis     Past Surgical History:  Procedure Laterality Date  . NO PAST SURGERIES    . oral surgery      Family History  Problem Relation Age of Onset  . Heart disease Mother   . Bronchitis Mother   . Asthma    . Hypertension    . Diabetes Brother     Social History Social History  Substance Use Topics  . Smoking status: Never Smoker  . Smokeless tobacco: Never Used  . Alcohol use No    No Known Allergies  Current Outpatient Prescriptions  Medication Sig Dispense Refill  . lisinopril (PRINIVIL,ZESTRIL) 10 MG tablet Take 10 mg by mouth daily.     No current facility-administered medications for this visit.     Review of Systems Review of Systems Constitutional: negative for fatigue and weight loss Respiratory: negative for cough and wheezing Cardiovascular: negative for chest pain, fatigue and palpitations Gastrointestinal: negative for abdominal pain and change in bowel habits Genitourinary:positive for pelvic pain Integument/breast: negative for nipple discharge Musculoskeletal:negative for myalgias Neurological: negative for gait problems and tremors Behavioral/Psych: negative for abusive relationship, depression Endocrine: negative for temperature intolerance      Blood pressure 132/86, pulse 69, weight 175 lb (79.4 kg), last menstrual period 06/14/2016.  Physical Exam Physical Exam General:   alert  Skin:   no rash or abnormalities  Lungs:   clear to auscultation bilaterally  Heart:   regular rate  and rhythm, S1, S2 normal, no murmur, click, rub or gallop  Breasts:   normal without suspicious masses, skin or nipple changes or axillary nodes  Abdomen:  normal findings: no organomegaly, soft, non-tender and no hernia  Pelvis:  External genitalia: normal general appearance Urinary system: urethral meatus normal and bladder without fullness, nontender Vaginal: normal without tenderness, induration or masses Cervix: normal appearance Adnexa: normal bimanual exam Uterus: anteverted and non-tender, enlarged, irregular contour    50% of 15 min visit spent on counseling and coordination of care.    Data Reviewed Wet prep and cultures  Assessment     Pelvic pain History of uterine fibroids    Plan    Wet prep and cultures ordered Ultrasound ordered F/u 2 weeks  No orders of the defined types were placed in this encounter.  No orders of the defined types were placed in this encounter.        Patient ID: Wahneta Bava, female   DOB: [birth year], 41 y.o.   MRN: [id number]

## 2016-06-20 LAB — CERVICOVAGINAL ANCILLARY ONLY
Bacterial vaginitis: NEGATIVE
Chlamydia: NEGATIVE
Neisseria Gonorrhea: NEGATIVE

## 2016-06-25 ENCOUNTER — Ambulatory Visit (HOSPITAL_COMMUNITY)
Admission: RE | Admit: 2016-06-25 | Discharge: 2016-06-25 | Disposition: A | Discharge status: Home / Self Care | Payer: Medicaid Other | Source: Ambulatory Visit | Attending: Obstetrics | Admitting: Obstetrics

## 2016-06-25 DIAGNOSIS — R102 Pelvic and perineal pain: Secondary | ICD-10-CM | POA: Diagnosis present

## 2016-06-25 DIAGNOSIS — D259 Leiomyoma of uterus, unspecified: Secondary | ICD-10-CM | POA: Insufficient documentation

## 2016-07-02 ENCOUNTER — Ambulatory Visit: Payer: Medicaid Other | Admitting: Obstetrics

## 2016-07-05 ENCOUNTER — Ambulatory Visit: Payer: Medicaid Other | Admitting: Obstetrics

## 2016-07-17 ENCOUNTER — Ambulatory Visit: Payer: Medicaid Other | Admitting: Obstetrics

## 2016-07-19 ENCOUNTER — Ambulatory Visit: Payer: Medicaid Other | Admitting: Allergy & Immunology

## 2016-07-19 ENCOUNTER — Encounter (INDEPENDENT_AMBULATORY_CARE_PROVIDER_SITE_OTHER): Payer: Self-pay

## 2016-07-23 ENCOUNTER — Ambulatory Visit: Payer: Medicaid Other | Admitting: Allergy & Immunology

## 2016-07-26 ENCOUNTER — Ambulatory Visit: Payer: Medicaid Other | Admitting: Obstetrics

## 2016-08-06 ENCOUNTER — Ambulatory Visit: Payer: Medicaid Other | Admitting: Obstetrics

## 2016-08-15 ENCOUNTER — Ambulatory Visit: Payer: Medicaid Other | Admitting: Obstetrics

## 2016-08-26 ENCOUNTER — Other Ambulatory Visit: Payer: Self-pay | Admitting: Obstetrics

## 2016-08-26 DIAGNOSIS — D251 Intramural leiomyoma of uterus: Secondary | ICD-10-CM

## 2016-08-27 ENCOUNTER — Ambulatory Visit (INDEPENDENT_AMBULATORY_CARE_PROVIDER_SITE_OTHER): Payer: Medicaid Other | Admitting: Obstetrics

## 2016-08-27 ENCOUNTER — Other Ambulatory Visit (HOSPITAL_COMMUNITY)
Admission: RE | Admit: 2016-08-27 | Discharge: 2016-08-27 | Disposition: A | Discharge status: Home / Self Care | Payer: Medicaid Other | Source: Ambulatory Visit | Attending: Obstetrics | Admitting: Obstetrics

## 2016-08-27 VITALS — BP 166/99

## 2016-08-27 DIAGNOSIS — D251 Intramural leiomyoma of uterus: Secondary | ICD-10-CM

## 2016-08-27 DIAGNOSIS — N898 Other specified noninflammatory disorders of vagina: Secondary | ICD-10-CM | POA: Diagnosis not present

## 2016-08-27 DIAGNOSIS — Z3169 Encounter for other general counseling and advice on procreation: Secondary | ICD-10-CM

## 2016-08-27 MED ORDER — PRENATAL PLUS 27-1 MG PO TABS: 1.0000 | ORAL_TABLET | Freq: Every day | ORAL | 11 refills | Status: DC

## 2016-08-27 NOTE — Progress Notes (Signed)
Pt is in office for u/s results.   Pt states that she is having some vaginal irritation that she would like checked today. Pt has increase BP today.  Pt state that she has been started on new BP medicaition but cannot remember name.  Pt has been advised to follow up with PCP regarding BP.

## 2016-08-28 ENCOUNTER — Encounter: Payer: Self-pay | Admitting: Obstetrics

## 2016-08-28 NOTE — Progress Notes (Signed)
Patient ID: [name], female   DOB: 29-Jul-1975, 41 y.o.   MRN: [id number]  Chief Complaint  Patient presents with  . Follow-up    u/s results and vaginitis    HPI [name] is a 41 y.o. female.  Presents for ultrasound results.  History of AUB. HPI  Past Medical History:  Diagnosis Date  . Anxiety   . Bronchitis   . Depressed   . Heart murmur   . Hypertension   . Strep pharyngitis     Past Surgical History:  Procedure Laterality Date  . NO PAST SURGERIES    . oral surgery      Family History  Problem Relation Age of Onset  . Heart disease Mother   . Bronchitis Mother   . Asthma    . Hypertension    . Diabetes Brother     Social History Social History  Substance Use Topics  . Smoking status: Never Smoker  . Smokeless tobacco: Never Used  . Alcohol use No    No Known Allergies  Current Outpatient Prescriptions  Medication Sig Dispense Refill  . ibuprofen (ADVIL,MOTRIN) 800 MG tablet Take 1 tablet (800 mg total) by mouth every 8 (eight) hours as needed. (Patient not taking: Reported on 08/27/2016) 30 tablet 5  . lisinopril (PRINIVIL,ZESTRIL) 10 MG tablet Take 10 mg by mouth daily.    . LO LOESTRIN FE 1 MG-10 MCG / 10 MCG tablet Take 1 tablet by mouth daily. (Patient not taking: Reported on 08/27/2016) 3 Package 4  . prenatal vitamin w/FE, FA (PRENATAL 1 + 1) 27-1 MG TABS tablet Take 1 tablet by mouth daily at 12 noon. 30 each 11   No current facility-administered medications for this visit.     Review of Systems Review of Systems Constitutional: negative for fatigue and weight loss Respiratory: negative for cough and wheezing Cardiovascular: negative for chest pain, fatigue and palpitations Gastrointestinal: negative for abdominal pain and change in bowel habits Genitourinary:positive for heavy, prolonged and painful periods Integument/breast: negative for nipple discharge Musculoskeletal:negative for myalgias Neurological: negative for gait  problems and tremors Behavioral/Psych: negative for abusive relationship, depression Endocrine: negative for temperature intolerance      Blood pressure (!) 166/99.  Physical Exam Physical Exam           General:  Alert and no distress Abdomen:  normal findings: no organomegaly, soft, non-tender and no hernia  Pelvis:  External genitalia: normal general appearance Urinary system: urethral meatus normal and bladder without fullness, nontender Vaginal: normal without tenderness, induration or masses Cervix: normal appearance Adnexa: normal bimanual exam Uterus: anteverted and non-tender, normal size    50% of 15 min visit spent on counseling and coordination of care.    Data Reviewed Ultrasound:  Multiple uterine fibroids, intramural  Assessment     Symptomatic uterine fibroids.  The patient still wants future fertility. AUB - Leiomyoma  Preconception Consultation    Plan    Recommended consultation for Uterine Fibroid Embolization vs Myomectomy Referred to Interventional Radiology. Prenatal Vitamins Rx F/U after IR Consultation  Orders Placed This Encounter  Procedures  . Ambulatory referral to Interventional Radiology    Referral Priority:   Routine    Referral Type:   Consultation    Referral Reason:   Specialty Services Required    Requested Specialty:   Interventional Radiology    Number of Visits Requested:   1   Meds ordered this encounter  Medications  . prenatal vitamin w/FE, FA (PRENATAL 1 +  1) 27-1 MG TABS tablet    Sig: Take 1 tablet by mouth daily at 12 noon.    Dispense:  30 each    Refill:  11

## 2016-08-29 ENCOUNTER — Ambulatory Visit: Payer: Medicaid Other | Admitting: Allergy & Immunology

## 2016-08-29 LAB — CERVICOVAGINAL ANCILLARY ONLY
Bacterial vaginitis: NEGATIVE
Candida vaginitis: NEGATIVE
Chlamydia: NEGATIVE
Neisseria Gonorrhea: NEGATIVE
Trichomonas: NEGATIVE

## 2018-02-26 ENCOUNTER — Ambulatory Visit: Payer: Medicaid Other | Admitting: Obstetrics

## 2018-03-11 ENCOUNTER — Ambulatory Visit: Payer: Medicaid Other | Admitting: Obstetrics

## 2018-07-01 ENCOUNTER — Encounter (HOSPITAL_COMMUNITY): Payer: Self-pay

## 2018-07-01 ENCOUNTER — Emergency Department (HOSPITAL_COMMUNITY)
Admission: EM | Admit: 2018-07-01 | Discharge: 2018-07-02 | Disposition: A | Discharge status: Home / Self Care | Payer: Medicaid Other | Attending: Emergency Medicine | Admitting: Emergency Medicine

## 2018-07-01 ENCOUNTER — Other Ambulatory Visit: Payer: Self-pay

## 2018-07-01 ENCOUNTER — Emergency Department (HOSPITAL_COMMUNITY): Payer: Medicaid Other

## 2018-07-01 DIAGNOSIS — X58XXXA Exposure to other specified factors, initial encounter: Secondary | ICD-10-CM | POA: Diagnosis not present

## 2018-07-01 DIAGNOSIS — R6884 Jaw pain: Secondary | ICD-10-CM | POA: Insufficient documentation

## 2018-07-01 DIAGNOSIS — Z79899 Other long term (current) drug therapy: Secondary | ICD-10-CM | POA: Diagnosis not present

## 2018-07-01 DIAGNOSIS — S29012A Strain of muscle and tendon of back wall of thorax, initial encounter: Secondary | ICD-10-CM | POA: Diagnosis not present

## 2018-07-01 DIAGNOSIS — Y999 Unspecified external cause status: Secondary | ICD-10-CM | POA: Diagnosis not present

## 2018-07-01 DIAGNOSIS — Y929 Unspecified place or not applicable: Secondary | ICD-10-CM | POA: Insufficient documentation

## 2018-07-01 DIAGNOSIS — I10 Essential (primary) hypertension: Secondary | ICD-10-CM | POA: Insufficient documentation

## 2018-07-01 DIAGNOSIS — Y939 Activity, unspecified: Secondary | ICD-10-CM | POA: Diagnosis not present

## 2018-07-01 DIAGNOSIS — M542 Cervicalgia: Secondary | ICD-10-CM | POA: Diagnosis present

## 2018-07-01 DIAGNOSIS — I159 Secondary hypertension, unspecified: Secondary | ICD-10-CM

## 2018-07-01 DIAGNOSIS — S46811A Strain of other muscles, fascia and tendons at shoulder and upper arm level, right arm, initial encounter: Secondary | ICD-10-CM

## 2018-07-01 LAB — CBC
HCT: 36.4 % (ref 36.0–46.0)
Hemoglobin: 11.2 g/dL — ABNORMAL LOW (ref 12.0–15.0)
MCH: 27.2 pg (ref 26.0–34.0)
MCHC: 30.8 g/dL (ref 30.0–36.0)
MCV: 88.3 fL (ref 80.0–100.0)
Platelets: 306 10*3/uL (ref 150–400)
RBC: 4.12 MIL/uL (ref 3.87–5.11)
RDW: 13.7 % (ref 11.5–15.5)
WBC: 8.8 10*3/uL (ref 4.0–10.5)
nRBC: 0 % (ref 0.0–0.2)

## 2018-07-01 LAB — BASIC METABOLIC PANEL
Anion gap: 7 (ref 5–15)
BUN: 9 mg/dL (ref 6–20)
CO2: 25 mmol/L (ref 22–32)
Calcium: 8.9 mg/dL (ref 8.9–10.3)
Chloride: 103 mmol/L (ref 98–111)
Creatinine, Ser: 0.89 mg/dL (ref 0.44–1.00)
GFR calc Af Amer: >60 mL/min (ref >60)
GFR calc non Af Amer: >60 mL/min (ref >60)
Glucose, Bld: 106 mg/dL — ABNORMAL HIGH (ref 70–99)
Potassium: 3.3 mmol/L — ABNORMAL LOW (ref 3.5–5.1)
Sodium: 135 mmol/L (ref 135–145)

## 2018-07-01 LAB — I-STAT BETA HCG BLOOD, ED (MC, WL, AP ONLY): I-stat hCG, quantitative: <5 m[IU]/mL (ref <5)

## 2018-07-01 LAB — I-STAT TROPONIN, ED: Troponin i, poc: 0 ng/mL (ref 0.00–0.08)

## 2018-07-01 MED ORDER — SODIUM CHLORIDE 0.9% FLUSH
3.0000 mL | Freq: Once | INTRAVENOUS | Status: AC
  Administered 2018-07-02: 3 mL via INTRAVENOUS

## 2018-07-01 NOTE — ED Triage Notes (Signed)
Pt states when breathing deep she gets sharp stabbing pains to chest and upper back.

## 2018-07-01 NOTE — ED Triage Notes (Signed)
Pt here for having right sided neck pain beginning on Saturday all went away then came back Sunday with neck and right jaw pain. HTN in triage. A&Ox4

## 2018-07-02 LAB — I-STAT TROPONIN, ED: Troponin i, poc: 0 ng/mL (ref 0.00–0.08)

## 2018-07-02 MED ORDER — ASPIRIN 325 MG PO TABS
325.0000 mg | ORAL_TABLET | Freq: Every day | ORAL | Status: DC
  Filled 2018-07-02: qty 1

## 2018-07-02 MED ORDER — CYCLOBENZAPRINE HCL 10 MG PO TABS: 10.0000 mg | ORAL_TABLET | Freq: Two times a day (BID) | ORAL | 0 refills | Status: DC | PRN

## 2018-07-02 MED ORDER — NAPROXEN 375 MG PO TABS: 375.0000 mg | ORAL_TABLET | Freq: Two times a day (BID) | ORAL | 0 refills | Status: DC

## 2018-07-02 MED ORDER — LISINOPRIL 10 MG PO TABS
10.0000 mg | ORAL_TABLET | Freq: Once | ORAL | Status: AC
  Administered 2018-07-02: 10 mg via ORAL
  Filled 2018-07-02: qty 1

## 2018-07-02 MED ORDER — LOSARTAN POTASSIUM 25 MG PO TABS: 25.0000 mg | ORAL_TABLET | Freq: Every day | ORAL | 0 refills | Status: DC

## 2018-07-02 NOTE — ED Provider Notes (Signed)
[facility] EMERGENCY DEPARTMENT Provider Note   CSN: [id number] Arrival date & time: 07/01/18  2133    History   Chief Complaint Chief Complaint  Patient presents with  . Neck Pain  . Jaw Pain    HPI [name] is a 43 y.o. female with a pmhx of HTN who presented to the ED today complaining of R sided jaw pain as well as posterior trapexiuz pain that radiates down her right arm. Symptoms began 5 days ago, improved only for 2 days and then returned yesterday around 7PM. No alleviating factors noted. She has not taken anything for her symptoms. No trauma or neck injury. She does report when she turns her head to the left that this increases her shoulder/trapezius pain. When she takes a deep breath she does experience chest tightness. Denies SOB, dizziness, vision changes, paresthesias, weakness in an extremity, syncope. She does have chronic hypertension and admits that she does not take her blood pressure medication at home. No tobacco use.      HPI  Past Medical History:  Diagnosis Date  . Anxiety   . Bronchitis   . Depressed   . Heart murmur   . Hypertension   . Strep pharyngitis     Patient Active Problem List   Diagnosis Date Noted  . 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 Fibroid, uterine 01/27/2015  . Unspecified disorder of menstruation and other abnormal bleeding from female genital tract 01/22/2014  . Dysmenorrhea 01/22/2014  . Cervical high risk human papillomavirus (HPV) DNA test positive 03/12/2013  . Vaginitis and vulvovaginitis, unspecified 09/03/2012    Past Surgical History:  Procedure Laterality Date  . NO PAST SURGERIES    . oral surgery       OB History    Gravida  2   Para  0   Term      Preterm      AB  2   Living  0     SAB  1   TAB  1   Ectopic      Multiple      Live Births               Home Medications    Prior to Admission medications     Medication Sig Start Date End Date Taking? Authorizing Provider  losartan (COZAAR) 25 MG tablet Take 25 mg by mouth daily.   Yes [provider]  ibuprofen (ADVIL,MOTRIN) 800 MG tablet Take 1 tablet (800 mg total) by mouth every 8 (eight) hours as needed. Patient not taking: Reported on 08/27/2016 06/15/16   Shelly Bombard, MD  LO LOESTRIN FE 1 MG-10 MCG / 10 MCG tablet Take 1 tablet by mouth daily. Patient not taking: Reported on 08/27/2016 06/15/16   Shelly Bombard, MD  prenatal vitamin w/FE, FA (PRENATAL 1 + 1) 27-1 MG TABS tablet Take 1 tablet by mouth daily at 12 noon. Patient not taking: Reported on 07/02/2018 08/27/16   Shelly Bombard, MD    Family History Family History  Problem Relation Age of Onset  . Heart disease Mother   . Bronchitis Mother   . Asthma Other   . Hypertension Other   . Diabetes Brother     Social History Social History   Tobacco Use  . Smoking status: Never Smoker  . Smokeless tobacco: Never Used  Substance Use Topics  . Alcohol use: No    Alcohol/week: 0.0 standard  drinks  . Drug use: No     Allergies   Patient has no known allergies.   Review of Systems Review of Systems  All other systems reviewed and are negative.    Physical Exam Updated Vital Signs BP (!) 155/87   Pulse 62   Temp 98.4 F (36.9 C) (Oral)   Resp 19   LMP 06/17/2018   SpO2 100%   Physical Exam Vitals signs and nursing note reviewed.  Constitutional:      General: She is not in acute distress.    Appearance: She is well-developed. She is not diaphoretic.  HENT:     Head: Normocephalic and atraumatic.     Mouth/Throat:     Pharynx: No oropharyngeal exudate.  Eyes:     General: No scleral icterus.       Right eye: No discharge.        Left eye: No discharge.     Conjunctiva/sclera: Conjunctivae normal.     Pupils: Pupils are equal, round, and reactive to light.  Neck:     Musculoskeletal: Normal range of motion and neck supple. No neck  rigidity.     Comments: TTP over R trapezius muscle, pain increased with left neck rotation. Cardiovascular:     Rate and Rhythm: Normal rate and regular rhythm.     Heart sounds: Normal heart sounds. No murmur. No friction rub. No gallop.   Pulmonary:     Effort: Pulmonary effort is normal. No respiratory distress.     Breath sounds: Normal breath sounds. No wheezing or rales.  Chest:     Chest wall: No tenderness.  Abdominal:     General: There is no distension.     Palpations: Abdomen is soft.     Tenderness: There is no abdominal tenderness. There is no guarding.  Musculoskeletal: Normal range of motion.     Right lower leg: No edema.     Left lower leg: No edema.  Lymphadenopathy:     Cervical: No cervical adenopathy.  Skin:    General: Skin is warm and dry.     Coloration: Skin is not pale.     Findings: No erythema or rash.  Neurological:     General: No focal deficit present.     Mental Status: She is alert and oriented to person, place, and time.     Cranial Nerves: No cranial nerve deficit.     Comments: Strength 5/5 throughout. No sensory deficits. No gait abnormality. No dysmetria. No slurred speech. No facial droop. Negative pronator drift.    Psychiatric:        Behavior: Behavior normal.      ED Treatments / Results  Labs (all labs ordered are listed, but only abnormal results are displayed) Labs Reviewed  CBC - Abnormal; Notable for the following components:      Result Value   Hemoglobin 11.2 (*)    All other components within normal limits  BASIC METABOLIC PANEL - Abnormal; Notable for the following components:   Potassium 3.3 (*)    Glucose, Bld 106 (*)    All other components within normal limits  I-STAT TROPONIN, ED  I-STAT BETA HCG BLOOD, ED ([facility], [facility], [facility] ONLY)  I-STAT TROPONIN, ED    EKG EKG Interpretation  Date/Time:  Tuesday July 01 2018 22:47:17 EST Ventricular Rate:  70 PR Interval:  174 QRS Duration: 76 QT Interval:  424 QTC  Calculation: 457 R Axis:   59 Text Interpretation:  Normal sinus rhythm  Nonspecific T wave abnormality Abnormal ECG Interpretation limited secondary to artifact Confirmed by Ripley Fraise [phone]) on 07/02/2018 6:18:56 AM Also confirmed by Ripley Fraise [phone]), editor Hattie Perch [phone])  on 07/02/2018 7:11:26 AM   Radiology Dg Chest 2 View  Result Date: 07/01/2018 CLINICAL DATA:  43 year old female with chest pain radiating to the right arm tonight. EXAM: CHEST - 2 VIEW COMPARISON:  03/06/2016 and earlier. FINDINGS: The heart size and mediastinal contours are within normal limits. Both lungs are clear. Normal lung volumes. No pneumothorax or pleural effusion. Negative visible bowel gas pattern. No significant osseous abnormality identified. IMPRESSION: Negative.  No cardiopulmonary abnormality. Electronically Signed   By: Genevie Ann M.D.   On: 07/01/2018 23:21    Procedures Procedures (including critical care time)  Medications Ordered in ED Medications  aspirin tablet 325 mg (has no administration in time range)  sodium chloride flush (NS) 0.9 % injection 3 mL (3 mLs Intravenous Given 07/02/18 0630)  lisinopril (PRINIVIL,ZESTRIL) tablet 10 mg (10 mg Oral Given 07/02/18 0705)     Initial Impression / Assessment and Plan / ED Course  I have reviewed the triage vital signs and the nursing notes.  Pertinent labs & imaging results that were available during my care of the patient were reviewed by me and considered in my medical decision making (see chart for details).        43 y.o F with a pmhx of uncontrolled HTN who presented to the ED to complaining of R sided posterior shoulder/trapezius pain that radiates to jaw and down right arm. Symptoms began about 5 days ago. She does endorse some chest "tightness" with deep breath although her trop and delta troponin are 0, EKG and CXR normal. She is also PERC negative so doubt ACS or PE. Radial pulses are equal bilaterally and she does  not have any neurological deficits. Doubt aortic dissection or carotid artery dissection. Her pain is reproducible with palpation of trapezius and left rotation of her neck. Suspect MSK injury to trap muscle. Also noted that she was hypertensive on presentation. She is non-compliant with her BP medication at home. I gave her a dose here and BP came down to 151/100. Education provided and she expresses that she will take her BP meds now. I will discharge her with NSAIDs and muscle relaxers.Return precautions outlined in patient discharge instructions.    Final Clinical Impressions(s) / ED Diagnoses   Final diagnoses:  None    ED Discharge Orders    None       Dowless, Dondra Spry, PA-C 07/02/18 [place], DO 07/02/18 1534

## 2018-07-02 NOTE — Discharge Instructions (Addendum)
Take naprosyn as needed for pain. Take muscle relaxers nightly, avoid driving while taking this medication. Follow up with your PCP next week if symptoms do not resolve. It is also very important that you take you blood pressure medication as prescribed. Return to the ED if you experience severe worsening of your symptoms, changes in your vision, chest pain, difficulty breathing, weakness of an extremity.

## 2018-07-02 NOTE — ED Notes (Signed)
Patient verbalizes understanding of discharge instructions. Opportunity for questioning and answers were provided. Armband removed by staff, pt discharged from ED. Ambulated out to lobby  

## 2018-10-03 ENCOUNTER — Ambulatory Visit: Payer: Medicaid Other | Admitting: Podiatry

## 2018-10-21 ENCOUNTER — Ambulatory Visit: Payer: Medicaid Other | Admitting: Podiatry

## 2018-10-30 ENCOUNTER — Other Ambulatory Visit: Payer: Self-pay

## 2018-10-30 ENCOUNTER — Encounter: Payer: Self-pay | Admitting: Podiatry

## 2018-10-30 ENCOUNTER — Ambulatory Visit (INDEPENDENT_AMBULATORY_CARE_PROVIDER_SITE_OTHER): Payer: Medicaid Other

## 2018-10-30 ENCOUNTER — Ambulatory Visit: Payer: Medicaid Other | Admitting: Podiatry

## 2018-10-30 VITALS — Temp 97.8°F

## 2018-10-30 DIAGNOSIS — M19071 Primary osteoarthritis, right ankle and foot: Secondary | ICD-10-CM

## 2018-10-30 DIAGNOSIS — Q6689 Other  specified congenital deformities of feet: Secondary | ICD-10-CM

## 2018-10-30 DIAGNOSIS — M7751 Other enthesopathy of right foot: Secondary | ICD-10-CM

## 2018-10-30 DIAGNOSIS — M779 Enthesopathy, unspecified: Secondary | ICD-10-CM

## 2018-10-30 MED ORDER — MELOXICAM 15 MG PO TABS: 15.0000 mg | ORAL_TABLET | Freq: Every day | ORAL | 0 refills | Status: AC

## 2018-10-31 ENCOUNTER — Telehealth: Payer: Self-pay | Admitting: *Deleted

## 2018-10-31 DIAGNOSIS — M19079 Primary osteoarthritis, unspecified ankle and foot: Secondary | ICD-10-CM

## 2018-10-31 NOTE — Progress Notes (Signed)
Subjective: 43 year old female presents the office today for concerns of right foot pain.  We decreased discussed the toe navicular joint arthrodesis in 2017 however due to other circumstances she did not want proceed with surgery.  She states her foot pain has been progressing since August 2019.  Now she started to have worsening pain to the foot mostly she points to the dorsal lateral aspect of the foot as well as the dorsal midfoot.  She gets some occasional swelling but she denies any recent injury or trauma. She also feels that her foot is starting to "curve".  No redness or warmth associated with swelling. Denies any systemic complaints such as fevers, chills, nausea, vomiting. No acute changes since last appointment, and no other complaints at this time.   Objective: AAO x3, NAD DP/PT pulses palpable bilaterally, CRT less than 3 seconds On the right foot but there is mild tenderness palpation on the dorsal medial aspect but only talonavicular joint but also the majority of tenderness is more to the dorsal lateral aspect of the foot today along the course of the Lisfranc joint.  There is mild swelling but there is no erythema or warmth.  There is no area pinpoint tenderness.  She does have difficulty with weightbearing to the foot. No open lesions or pre-ulcerative lesions.  No pain with calf compression, swelling, warmth, erythema  Assessment: Right chronic foot pain; congenital coalition/arthritis  Plan: -All treatment options discussed with the patient including all alternatives, risks, complications.  -X-rays were obtained and reviewed with the patient.  Arthritic changes present in the talonavicular joint.  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 -Given her discomfort a CAM boot was dispensed today. -Prescribed mobic. Discussed side effects of the medication and directed to stop if any are to occur and call the office.  -Ice to the area daily. -Given the  progressive pain as well as swelling I would order an MRI.  This is for surgical planning her pain has moved as well as dorsal lateral aspect of the foot. -Patient encouraged to call the office with any questions, concerns, change in symptoms.   Trula Slade DPM

## 2018-10-31 NOTE — Telephone Encounter (Deleted)
-----   Message from Trula Slade, DPM sent at 10/30/2018  4:21 PM EDT ----- Can you please order an MRI of the right foot to evaluate TN arthritis but also dorsal lateral foot pain, swelling. I would like the foot and ankle but if they make me choose lets do the foot.

## 2018-11-03 NOTE — Telephone Encounter (Signed)
-----   Message from Trula Slade, DPM sent at 10/30/2018  4:21 PM EDT ----- Can you please order an MRI of the right foot to evaluate TN arthritis but also dorsal lateral foot pain, swelling. I would like the foot and ankle but if they make me choose lets do the foot.

## 2018-11-03 NOTE — Telephone Encounter (Signed)
[employer] - Medicaid requires clinicals prior to pre-cert, for Case:  [id number]. Faxed required clinicals, orders to [place].

## 2018-11-10 ENCOUNTER — Telehealth: Payer: Self-pay | Admitting: Podiatry

## 2018-11-10 NOTE — Telephone Encounter (Signed)
Called patient and tried to leave a message but the voice mail was full. [name]

## 2018-11-10 NOTE — Telephone Encounter (Signed)
[name] called stating that the patient called and cancelled her appt for her MRI due to it not being authorized by her insurance. Pt would like to know what her next step is and stated she is supposed to have surgery and didn't know if the MRI was required.

## 2018-11-13 ENCOUNTER — Other Ambulatory Visit: Payer: Medicaid Other

## 2018-11-27 ENCOUNTER — Telehealth: Payer: Self-pay | Admitting: *Deleted

## 2018-11-27 NOTE — Telephone Encounter (Signed)
I reviewed the Medicaid's denial response and message Dr. Jacqualyn Posey to advise.

## 2018-11-27 NOTE — Telephone Encounter (Signed)
She needs 6 weeks of conservative treatment since it has been some time since I saw her. Please have her continue the CAM boot, ice, elevate. She can also do voltaren gel OTC. Please have her come in to see me 6 weeks after her last appointment

## 2018-11-27 NOTE — Telephone Encounter (Signed)
Pt called states her MRI was denied and she wanted to know what to do next.

## 2018-11-28 NOTE — Telephone Encounter (Signed)
Called patient and left a voicemail for her to call back and schedule a f/u appt with Dr.Wagoner

## 2018-11-28 NOTE — Telephone Encounter (Signed)
Left message informing pt I had reviewed the denial explanation for the MRI and she was required to have had conservative therapy for 6 weeks and to call our appt line to schedule for 6 weeks after her last appt.

## 2018-12-16 ENCOUNTER — Ambulatory Visit: Payer: Medicaid Other | Admitting: Podiatry

## 2018-12-30 ENCOUNTER — Ambulatory Visit: Payer: Medicaid Other | Admitting: Podiatry

## 2019-02-17 ENCOUNTER — Ambulatory Visit: Payer: Medicaid Other | Admitting: Podiatry

## 2019-08-11 ENCOUNTER — Ambulatory Visit: Payer: Medicaid Other | Admitting: Obstetrics

## 2019-08-26 ENCOUNTER — Other Ambulatory Visit: Payer: Self-pay

## 2019-08-26 ENCOUNTER — Encounter: Payer: Self-pay | Admitting: Obstetrics

## 2019-08-26 ENCOUNTER — Ambulatory Visit: Payer: Medicaid Other | Admitting: Obstetrics

## 2019-08-26 ENCOUNTER — Other Ambulatory Visit (HOSPITAL_COMMUNITY)
Admission: RE | Admit: 2019-08-26 | Discharge: 2019-08-26 | Disposition: A | Discharge status: Home / Self Care | Payer: Medicaid Other | Source: Ambulatory Visit | Attending: Obstetrics | Admitting: Obstetrics

## 2019-08-26 VITALS — BP 127/84 | HR 68 | Ht 64.5 in | Wt 202.0 lb

## 2019-08-26 DIAGNOSIS — N760 Acute vaginitis: Secondary | ICD-10-CM

## 2019-08-26 DIAGNOSIS — E66811 Obesity, class 1: Secondary | ICD-10-CM

## 2019-08-26 DIAGNOSIS — Z0000 Encounter for general adult medical examination without abnormal findings: Secondary | ICD-10-CM

## 2019-08-26 DIAGNOSIS — N898 Other specified noninflammatory disorders of vagina: Secondary | ICD-10-CM

## 2019-08-26 DIAGNOSIS — N946 Dysmenorrhea, unspecified: Secondary | ICD-10-CM

## 2019-08-26 DIAGNOSIS — Z01419 Encounter for gynecological examination (general) (routine) without abnormal findings: Secondary | ICD-10-CM | POA: Insufficient documentation

## 2019-08-26 DIAGNOSIS — Z1151 Encounter for screening for human papillomavirus (HPV): Secondary | ICD-10-CM

## 2019-08-26 DIAGNOSIS — Z1239 Encounter for other screening for malignant neoplasm of breast: Secondary | ICD-10-CM

## 2019-08-26 DIAGNOSIS — D251 Intramural leiomyoma of uterus: Secondary | ICD-10-CM

## 2019-08-26 DIAGNOSIS — Z113 Encounter for screening for infections with a predominantly sexual mode of transmission: Secondary | ICD-10-CM | POA: Diagnosis not present

## 2019-08-26 DIAGNOSIS — Z3169 Encounter for other general counseling and advice on procreation: Secondary | ICD-10-CM

## 2019-08-26 DIAGNOSIS — N939 Abnormal uterine and vaginal bleeding, unspecified: Secondary | ICD-10-CM

## 2019-08-26 DIAGNOSIS — E669 Obesity, unspecified: Secondary | ICD-10-CM

## 2019-08-26 MED ORDER — VITAFOL ULTRA 29-0.6-0.4-200 MG PO CAPS: 1.0000 | ORAL_CAPSULE | Freq: Every day | ORAL | 11 refills | Status: DC

## 2019-08-26 MED ORDER — IBUPROFEN 800 MG PO TABS: 800.0000 mg | ORAL_TABLET | Freq: Three times a day (TID) | ORAL | 5 refills | Status: DC | PRN

## 2019-08-26 NOTE — Progress Notes (Signed)
Subjective:        [name] is a 44 y.o. female here for a routine exam.  Current complaints: Irregular periods, sometimes twice a month, duration 6 days with moderate cramping.    Personal health questionnaire:  Is patient Ashkenazi Jewish, have a family history of breast and/or ovarian cancer: no Is there a family history of uterine cancer diagnosed at age < 36, gastrointestinal cancer, urinary tract cancer, family member who is a Field seismologist syndrome-associated carrier: no Is the patient overweight and hypertensive, family history of diabetes, personal history of gestational diabetes, preeclampsia or PCOS: yes Is patient over 7, have PCOS,  family history of premature CHD under age 70, diabetes, smoke, have hypertension or peripheral artery disease:  no At any time, has a partner hit, kicked or otherwise hurt or frightened you?: no Over the past 2 weeks, have you felt down, depressed or hopeless?: no Over the past 2 weeks, have you felt little interest or pleasure in doing things?:no   Gynecologic History Patient's last menstrual period was 08/16/2019. Contraception: none Last Pap: 2017. Results were: normal Last mammogram: 2020. Results were: normal  Obstetric History OB History  Gravida Para Term Preterm AB Living  2 0     2 0  SAB TAB Ectopic Multiple Live Births  1 1          # Outcome Date GA Lbr Len/2nd Weight Sex Delivery Anes PTL Lv  2 TAB           1 SAB             Past Medical History:  Diagnosis Date  . Anxiety   . Bronchitis   . Depressed   . Heart murmur   . Hypertension   . Strep pharyngitis     Past Surgical History:  Procedure Laterality Date  . NO PAST SURGERIES    . oral surgery       Current Outpatient Medications:  .  amLODipine (NORVASC) 10 MG tablet, Take 10 mg by mouth daily., Disp: , Rfl:  .  cyclobenzaprine (FLEXERIL) 10 MG tablet, Take 1 tablet (10 mg total) by mouth 2 (two) times daily as needed for muscle spasms., Disp: 20  tablet, Rfl: 0 .  ibuprofen (ADVIL) 800 MG tablet, Take 1 tablet (800 mg total) by mouth every 8 (eight) hours as needed., Disp: 30 tablet, Rfl: 5 .  LO LOESTRIN FE 1 MG-10 MCG / 10 MCG tablet, Take 1 tablet by mouth daily. (Patient not taking: Reported on 08/27/2016), Disp: 3 Package, Rfl: 4 .  meloxicam (MOBIC) 15 MG tablet, Take 1 tablet (15 mg total) by mouth daily. (Patient not taking: Reported on 08/26/2019), Disp: 30 tablet, Rfl: 0 .  naproxen (NAPROSYN) 375 MG tablet, Take 1 tablet (375 mg total) by mouth 2 (two) times daily. (Patient not taking: Reported on 08/26/2019), Disp: 20 tablet, Rfl: 0 .  Prenat-Fe Poly-Methfol-FA-DHA (VITAFOL ULTRA) 29-0.6-0.4-200 MG CAPS, Take 1 capsule by mouth daily before breakfast., Disp: 30 capsule, Rfl: 11 .  prenatal vitamin w/FE, FA (PRENATAL 1 + 1) 27-1 MG TABS tablet, Take 1 tablet by mouth daily at 12 noon. (Patient not taking: Reported on 07/02/2018), Disp: 30 each, Rfl: 11 No Known Allergies  Social History   Tobacco Use  . Smoking status: Never Smoker  . Smokeless tobacco: Never Used  Substance Use Topics  . Alcohol use: No    Alcohol/week: 0.0 standard drinks    Family History  Problem Relation Age of  Onset  . Heart disease Mother   . Bronchitis Mother   . Asthma Other   . Hypertension Other   . Diabetes Brother       Review of Systems  Constitutional: negative for fatigue and weight loss Respiratory: negative for cough and wheezing Cardiovascular: negative for chest pain, fatigue and palpitations Gastrointestinal: negative for abdominal pain and change in bowel habits Musculoskeletal:negative for myalgias Neurological: negative for gait problems and tremors Behavioral/Psych: negative for abusive relationship, depression Endocrine: negative for temperature intolerance    Genitourinary:negative for abnormal menstrual periods, genital lesions, hot flashes, sexual problems and vaginal discharge Integument/breast: negative for breast  lump, breast tenderness, nipple discharge and skin lesion(s)    Objective:       BP 127/84   Pulse 68   Ht 5' 4.5" (1.638 m)   Wt 202 lb (91.6 kg)   LMP 08/16/2019   BMI 34.14 kg/m  General:   alert  Skin:   no rash or abnormalities  Lungs:   clear to auscultation bilaterally  Heart:   regular rate and rhythm, S1, S2 normal, no murmur, click, rub or gallop  Breasts:   normal without suspicious masses, skin or nipple changes or axillary nodes  Abdomen:  normal findings: no organomegaly, soft, non-tender and no hernia  Pelvis:  External genitalia: normal general appearance Urinary system: urethral meatus normal and bladder without fullness, nontender Vaginal: normal without tenderness, induration or masses Cervix: normal appearance Adnexa: normal bimanual exam Uterus: anteverted and non-tender, normal size   Lab Review Urine pregnancy test Labs reviewed yes Radiologic studies reviewed yes  50% of 25 min visit spent on counseling and coordination of care.   Assessment:     1. Encounter for routine gynecological examination with Papanicolaou smear of cervix Rx: - Cytology - PAP( [place])  2. Fibroids, intramural Rx: - US PELVIC COMPLETE WITH TRANSVAGINAL; Future  3. Dysmenorrhea Rx: - ibuprofen (ADVIL) 800 MG tablet; Take 1 tablet (800 mg total) by mouth every 8 (eight) hours as needed.  Dispense: 30 tablet; Refill: 5  4. Vaginal discharge Rx: - Cervicovaginal ancillary only( [place])  5. Screen for STD (sexually transmitted disease) Rx: - HIV Antibody (routine testing w rflx) - Hepatitis B surface antigen - RPR - Hepatitis C antibody  6. Obesity (BMI 30.0-34.9) - program of caloric reduction, exercise and behavioral modification recommended  7. Screening breast examination Rx: - MM Digital Screening; Future  8. Routine adult health maintenance Rx: - CBC - TSH - Hemoglobin A1c  9. Encounter for preconception consultation Rx: - Prenat-Fe  Poly-Methfol-FA-DHA (VITAFOL ULTRA) 29-0.6-0.4-200 MG CAPS; Take 1 capsule by mouth daily before breakfast.  Dispense: 30 capsule; Refill: 11   Plan:    Education reviewed: calcium supplements, depression evaluation, low fat, low cholesterol diet, safe sex/STD prevention, self breast exams and weight bearing exercise. Contraception: undecided. Mammogram ordered. Follow up in: 2 weeks.   Meds ordered this encounter  Medications  . Prenat-Fe Poly-Methfol-FA-DHA (VITAFOL ULTRA) 29-0.6-0.4-200 MG CAPS    Sig: Take 1 capsule by mouth daily before breakfast.    Dispense:  30 capsule    Refill:  11  . ibuprofen (ADVIL) 800 MG tablet    Sig: Take 1 tablet (800 mg total) by mouth every 8 (eight) hours as needed.    Dispense:  30 tablet    Refill:  5   Orders Placed This Encounter  Procedures  . US PELVIC COMPLETE WITH TRANSVAGINAL    Standing Status:  Future    Standing Expiration Date:   10/25/2020    Order Specific Question:   Reason for Exam (SYMPTOM  OR DIAGNOSIS REQUIRED)    Answer:   Fibroids    Order Specific Question:   Preferred imaging location?    Answer:   [facility]  . MM Digital Screening    Standing Status:   Future    Standing Expiration Date:   10/25/2020    Order Specific Question:   Reason for Exam (SYMPTOM  OR DIAGNOSIS REQUIRED)    Answer:   Screening    Order Specific Question:   Is the patient pregnant?    Answer:   No    Order Specific Question:   Preferred imaging location?    Answer:   [facility]  . HIV Antibody (routine testing w rflx)  . Hepatitis B surface antigen  . RPR  . Hepatitis C antibody  . CBC  . TSH  . Hemoglobin A1c    Shelly Bombard, MD 08/26/2019 11:10 AM

## 2019-08-26 NOTE — Patient Instructions (Addendum)
Endometrial Biopsy  Endometrial biopsy is a procedure in which a tissue sample is taken from inside the uterus. The sample is taken from the endometrium, which is the lining of the uterus. The tissue sample is then checked under a microscope to see if the tissue is normal or abnormal. This procedure helps to determine where you are in your menstrual cycle and how hormone levels are affecting the lining of the uterus. This procedure may also be used to evaluate uterine bleeding or to diagnose endometrial cancer, endometrial tuberculosis, polyps, or other inflammatory conditions. Tell a health care provider about:  Any allergies you have.  All medicines you are taking, including vitamins, herbs, eye drops, creams, and over-the-counter medicines.  Any problems you or family members have had with anesthetic medicines.  Any blood disorders you have.  Any surgeries you have had.  Any medical conditions you have.  Whether you are pregnant or may be pregnant. What are the risks? Generally, this is a safe procedure. However, problems may occur, including:  Bleeding.  Pelvic infection.  Puncture of the wall of the uterus with the biopsy device (rare). What happens before the procedure?  Keep a record of your menstrual cycles as told by your health care provider. You may need to schedule your procedure for a specific time in your cycle.  You may want to bring a sanitary pad to wear after the procedure.  Ask your health care provider about: ? Changing or stopping your regular medicines. This is especially important if you are taking diabetes medicines or blood thinners. ? Taking medicines such as aspirin and ibuprofen. These medicines can thin your blood. Do not take these medicines before your procedure if your health care provider instructs you not to.  Plan to have someone take you home from the hospital or clinic. What happens during the procedure?  To lower your risk of  infection: ? Your health care team will wash or sanitize their hands.  You will lie on an exam table with your feet and legs supported as in a pelvic exam.  Your health care provider will insert an instrument (speculum) into your vagina to see your cervix.  Your cervix will be cleansed with an antiseptic solution.  A medicine (local anesthetic) will be used to numb the cervix.  A forceps instrument (tenaculum) will be used to hold your cervix steady for the biopsy.  A thin, rod-like instrument (uterine sound) will be inserted through your cervix to determine the length of your uterus and the location where the biopsy sample will be removed.  A thin, flexible tube (catheter) will be inserted through your cervix and into the uterus. The catheter will be used to collect the biopsy sample from your endometrial tissue.  The catheter and speculum will then be removed, and the tissue sample will be sent to a lab for examination. What happens after the procedure?  You will rest in a recovery area until you are ready to go home.  You may have mild cramping and a small amount of vaginal bleeding. This is normal.  It is up to you to get the results of your procedure. Ask your health care provider, or the department that is doing the procedure, when your results will be ready. Summary  Endometrial biopsy is a procedure in which a tissue sample is taken from the endometrium, which is the lining of the uterus.  This procedure may help to diagnose menstrual cycle problems, abnormal bleeding, or other conditions affecting   the endometrium.  Before the procedure, keep a record of your menstrual cycles as told by your health care provider.  The tissue sample that is removed will be checked under a microscope to see if it is normal or abnormal. This information is not intended to replace advice given to you by your health care provider. Make sure you discuss any questions you have with your health care  provider. Document Revised: 04/12/2017 Document Reviewed: 05/16/2016 Elsevier Patient Education  2020 Elsevier Inc.  

## 2019-08-27 LAB — HEMOGLOBIN A1C
Est. average glucose Bld gHb Est-mCnc: 105 mg/dL
Hgb A1c MFr Bld: 5.3 % (ref 4.8–5.6)

## 2019-08-27 LAB — CERVICOVAGINAL ANCILLARY ONLY
Bacterial Vaginitis (gardnerella): POSITIVE — AB
Candida Glabrata: NEGATIVE
Candida Vaginitis: NEGATIVE
Chlamydia: NEGATIVE
Comment: NEGATIVE
Comment: NEGATIVE
Comment: NEGATIVE
Comment: NEGATIVE
Comment: NEGATIVE
Comment: NORMAL
Neisseria Gonorrhea: NEGATIVE
Trichomonas: NEGATIVE

## 2019-08-27 LAB — CBC
Hematocrit: 38.4 % (ref 34.0–46.6)
Hemoglobin: 12.6 g/dL (ref 11.1–15.9)
MCH: 29.6 pg (ref 26.6–33.0)
MCHC: 32.8 g/dL (ref 31.5–35.7)
MCV: 90 fL (ref 79–97)
Platelets: 346 10*3/uL (ref 150–450)
RBC: 4.25 x10E6/uL (ref 3.77–5.28)
RDW: 13.8 % (ref 11.7–15.4)
WBC: 9.3 10*3/uL (ref 3.4–10.8)

## 2019-08-27 LAB — CYTOLOGY - PAP
Adequacy: ABSENT
Comment: NEGATIVE
Diagnosis: NEGATIVE
High risk HPV: NEGATIVE

## 2019-08-27 LAB — TSH: TSH: 2.05 u[IU]/mL (ref 0.450–4.500)

## 2019-08-27 LAB — HEPATITIS B SURFACE ANTIGEN: Hepatitis B Surface Ag: NEGATIVE

## 2019-08-27 LAB — HIV ANTIBODY (ROUTINE TESTING W REFLEX): HIV Screen 4th Generation wRfx: NONREACTIVE

## 2019-08-27 LAB — HEPATITIS C ANTIBODY: Hep C Virus Ab: 0.2 s/co ratio (ref 0.0–0.9)

## 2019-08-27 LAB — RPR: RPR Ser Ql: NONREACTIVE

## 2019-08-28 ENCOUNTER — Other Ambulatory Visit: Payer: Self-pay | Admitting: Obstetrics

## 2019-08-28 ENCOUNTER — Telehealth: Payer: Self-pay

## 2019-08-28 DIAGNOSIS — N760 Acute vaginitis: Secondary | ICD-10-CM

## 2019-08-28 DIAGNOSIS — B9689 Other specified bacterial agents as the cause of diseases classified elsewhere: Secondary | ICD-10-CM

## 2019-08-28 MED ORDER — TINIDAZOLE 500 MG PO TABS: 1000.0000 mg | ORAL_TABLET | Freq: Every day | ORAL | 2 refills | Status: DC

## 2019-08-28 NOTE — Telephone Encounter (Signed)
Called to advise of results, no answer, left vm ?

## 2019-08-31 ENCOUNTER — Ambulatory Visit (HOSPITAL_COMMUNITY): Admission: RE | Admit: 2019-08-31 | Payer: Medicaid Other | Source: Ambulatory Visit

## 2019-09-09 ENCOUNTER — Other Ambulatory Visit: Payer: Medicaid Other | Admitting: Obstetrics

## 2019-09-15 ENCOUNTER — Other Ambulatory Visit: Payer: Medicaid Other | Admitting: Obstetrics

## 2019-09-22 ENCOUNTER — Other Ambulatory Visit: Payer: Self-pay

## 2019-09-22 ENCOUNTER — Ambulatory Visit
Admission: RE | Admit: 2019-09-22 | Discharge: 2019-09-22 | Disposition: A | Discharge status: Home / Self Care | Payer: Medicaid Other | Source: Ambulatory Visit | Attending: Obstetrics | Admitting: Obstetrics

## 2019-09-22 DIAGNOSIS — Z1239 Encounter for other screening for malignant neoplasm of breast: Secondary | ICD-10-CM

## 2019-10-06 ENCOUNTER — Other Ambulatory Visit: Payer: Medicaid Other | Admitting: Obstetrics

## 2019-10-07 ENCOUNTER — Other Ambulatory Visit: Payer: Medicaid Other | Admitting: Obstetrics

## 2019-10-07 ENCOUNTER — Inpatient Hospital Stay: Admission: RE | Admit: 2019-10-07 | Payer: Medicaid Other | Source: Ambulatory Visit

## 2020-02-02 ENCOUNTER — Ambulatory Visit
Admission: RE | Admit: 2020-02-02 | Discharge: 2020-02-02 | Disposition: A | Discharge status: Home / Self Care | Payer: Medicaid Other | Source: Ambulatory Visit | Attending: Obstetrics | Admitting: Obstetrics

## 2020-02-02 ENCOUNTER — Other Ambulatory Visit: Payer: Self-pay

## 2020-02-02 DIAGNOSIS — D251 Intramural leiomyoma of uterus: Secondary | ICD-10-CM

## 2020-02-09 ENCOUNTER — Encounter: Payer: Self-pay | Admitting: Obstetrics

## 2020-02-09 ENCOUNTER — Ambulatory Visit: Payer: Medicaid Other | Admitting: Obstetrics

## 2020-02-09 ENCOUNTER — Other Ambulatory Visit: Payer: Self-pay

## 2020-02-09 VITALS — BP 139/91 | HR 69 | Wt 207.0 lb

## 2020-02-09 DIAGNOSIS — N939 Abnormal uterine and vaginal bleeding, unspecified: Secondary | ICD-10-CM | POA: Diagnosis not present

## 2020-02-09 DIAGNOSIS — D251 Intramural leiomyoma of uterus: Secondary | ICD-10-CM | POA: Diagnosis not present

## 2020-02-09 DIAGNOSIS — N946 Dysmenorrhea, unspecified: Secondary | ICD-10-CM

## 2020-02-09 MED ORDER — IBUPROFEN 800 MG PO TABS: 800.0000 mg | ORAL_TABLET | Freq: Three times a day (TID) | ORAL | 5 refills | Status: DC | PRN

## 2020-02-09 NOTE — Progress Notes (Signed)
Endometrial Biopsy Procedure Note  Pre-operative Diagnosis: AUB                                              Uterine Fibroids  Post-operative Diagnosis: same  Indications: abnormal uterine bleeding  Procedure Details   Urine pregnancy test was done in office and result was negative.  The risks (including infection, bleeding, pain, and uterine perforation) and benefits of the procedure were explained to the patient and Written informed consent was obtained.    The patient was placed in the dorsal lithotomy position.  Bimanual exam showed the uterus to be in the neutral position.  A Graves' speculum inserted in the vagina, and the cervix prepped with povidone iodine.  Endocervical curettage with a Kevorkian curette was not performed.   A sharp tenaculum was applied to the anterior lip of the cervix for stabilization.  A sterile uterine sound was used to sound the uterus to a depth of 6cm.  A Pipelle endometrial aspirator was used to sample the endometrium.  Sample was sent for pathologic examination.  Condition: Stable  Complications: None  Plan:  The patient was advised to call for any fever or for prolonged or severe pain or bleeding. She was advised to use NSAID as needed for mild to moderate pain. She was advised to avoid vaginal intercourse for 48 hours or until the bleeding has completely stopped.  Attending Physician Documentation: I was present for or participated in the entire procedure, including opening and closing.   Shelly Bombard, MD 02/09/2020 3:32 PM

## 2020-02-09 NOTE — Addendum Note (Signed)
Addended by: Baltazar Najjar A on: 02/09/2020 04:00 PM   Modules accepted: Orders

## 2020-02-10 ENCOUNTER — Other Ambulatory Visit (HOSPITAL_COMMUNITY)
Admission: RE | Admit: 2020-02-10 | Discharge: 2020-02-10 | Disposition: A | Discharge status: Home / Self Care | Payer: Medicaid Other | Source: Ambulatory Visit | Attending: Obstetrics | Admitting: Obstetrics

## 2020-02-10 DIAGNOSIS — N946 Dysmenorrhea, unspecified: Secondary | ICD-10-CM | POA: Insufficient documentation

## 2020-02-10 DIAGNOSIS — D251 Intramural leiomyoma of uterus: Secondary | ICD-10-CM | POA: Insufficient documentation

## 2020-02-10 DIAGNOSIS — N939 Abnormal uterine and vaginal bleeding, unspecified: Secondary | ICD-10-CM | POA: Diagnosis not present

## 2020-02-10 NOTE — Addendum Note (Signed)
Addended by: Lewie Loron D on: 02/10/2020 03:58 PM   Modules accepted: Orders

## 2020-02-12 LAB — SURGICAL PATHOLOGY

## 2020-02-15 ENCOUNTER — Other Ambulatory Visit: Payer: Self-pay

## 2020-02-15 ENCOUNTER — Ambulatory Visit: Payer: Medicaid Other | Admitting: Podiatry

## 2020-02-23 ENCOUNTER — Emergency Department (HOSPITAL_COMMUNITY)
Admission: EM | Admit: 2020-02-23 | Discharge: 2020-02-23 | Disposition: A | Discharge status: Home / Self Care | Payer: Medicaid Other | Attending: Emergency Medicine | Admitting: Emergency Medicine

## 2020-02-23 ENCOUNTER — Other Ambulatory Visit: Payer: Self-pay

## 2020-02-23 ENCOUNTER — Encounter (HOSPITAL_COMMUNITY): Payer: Self-pay | Admitting: Emergency Medicine

## 2020-02-23 DIAGNOSIS — R55 Syncope and collapse: Secondary | ICD-10-CM

## 2020-02-23 DIAGNOSIS — Z79899 Other long term (current) drug therapy: Secondary | ICD-10-CM | POA: Diagnosis not present

## 2020-02-23 DIAGNOSIS — I10 Essential (primary) hypertension: Secondary | ICD-10-CM | POA: Diagnosis not present

## 2020-02-23 LAB — CBC
HCT: 34.5 % — ABNORMAL LOW (ref 36.0–46.0)
Hemoglobin: 10.9 g/dL — ABNORMAL LOW (ref 12.0–15.0)
MCH: 27.5 pg (ref 26.0–34.0)
MCHC: 31.6 g/dL (ref 30.0–36.0)
MCV: 87.1 fL (ref 80.0–100.0)
Platelets: 292 10*3/uL (ref 150–400)
RBC: 3.96 MIL/uL (ref 3.87–5.11)
RDW: 16.5 % — ABNORMAL HIGH (ref 11.5–15.5)
WBC: 8.2 10*3/uL (ref 4.0–10.5)
nRBC: 0 % (ref 0.0–0.2)

## 2020-02-23 LAB — URINALYSIS, ROUTINE W REFLEX MICROSCOPIC
Bacteria, UA: NONE SEEN
Bilirubin Urine: NEGATIVE
Glucose, UA: NEGATIVE mg/dL
Ketones, ur: NEGATIVE mg/dL
Leukocytes,Ua: NEGATIVE
Nitrite: NEGATIVE
Protein, ur: NEGATIVE mg/dL
Specific Gravity, Urine: 1.001 — ABNORMAL LOW (ref 1.005–1.030)
pH: 6 (ref 5.0–8.0)

## 2020-02-23 LAB — BASIC METABOLIC PANEL
Anion gap: 9 (ref 5–15)
BUN: 8 mg/dL (ref 6–20)
CO2: 23 mmol/L (ref 22–32)
Calcium: 9.3 mg/dL (ref 8.9–10.3)
Chloride: 104 mmol/L (ref 98–111)
Creatinine, Ser: 0.95 mg/dL (ref 0.44–1.00)
GFR, Estimated: >60 mL/min (ref >60)
Glucose, Bld: 95 mg/dL (ref 70–99)
Potassium: 3.8 mmol/L (ref 3.5–5.1)
Sodium: 136 mmol/L (ref 135–145)

## 2020-02-23 LAB — I-STAT BETA HCG BLOOD, ED (MC, WL, AP ONLY): I-stat hCG, quantitative: <5 m[IU]/mL (ref <5)

## 2020-02-23 LAB — CBG MONITORING, ED: Glucose-Capillary: 72 mg/dL (ref 70–99)

## 2020-02-23 NOTE — ED Notes (Signed)
Orthostatic BP complete:  Laying 170/97 Sitting 173/101 Standing 179/95

## 2020-02-23 NOTE — ED Provider Notes (Signed)
[facility] DEPT Provider Note  CSN: [id number] Arrival date & time: 02/23/20 0005  Chief Complaint(s) Near Syncope  HPI [name] is a 44 y.o. female with a history of hypertension who was reportedly recently transitioned from lisinopril to metoprolol is here for near syncopal episode.  Patient reports that this occurred after standing from a sitting position while at the kitchen table.  She reports that she has been having intermittent headaches that her migratory from right to left described as sharp achiness.  She reports having that same headache earlier this evening which worsened after standing.  Currently she is headache free.  Additionally she reported feeling rapid heart rate.  After her symptoms, she reports taking her nighttime metoprolol.  Reports that she is not compliant with her medication, stating that she chooses to not take her medications from time to time.  She denies any related chest pain or shortness of breath during this episode.  No recent nausea or vomiting.  No abdominal pain.  No diarrhea.  Last menstrual cycle was 3 weeks ago.  Denies any alcohol or illicit drug use.  Currently has no physical complaints.  HPI  Past Medical History Past Medical History:  Diagnosis Date  . Anxiety   . Bronchitis   . Depressed   . Heart murmur   . Hypertension   . Strep pharyngitis    Patient Active Problem List   Diagnosis Date Noted  . 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 Fibroid, uterine 01/27/2015  . Unspecified disorder of menstruation and other abnormal bleeding from female genital tract 01/22/2014  . Dysmenorrhea 01/22/2014  . Cervical high risk human papillomavirus (HPV) DNA test positive 03/12/2013  . Vaginitis and vulvovaginitis, unspecified 09/03/2012   Home Medication(s) Prior to Admission medications   Medication Sig Start Date End Date Taking? Authorizing Provider  ibuprofen  (ADVIL) 800 MG tablet Take 1 tablet (800 mg total) by mouth every 8 (eight) hours as needed. 02/09/20  Yes Shelly Bombard, MD  metoprolol tartrate (LOPRESSOR) 25 MG tablet Take 25 mg by mouth 2 (two) times daily. 01/26/20  Yes [provider]  Prenat-Fe Poly-Methfol-FA-DHA (VITAFOL ULTRA) 29-0.6-0.4-200 MG CAPS Take 1 capsule by mouth daily before breakfast. 08/26/19  Yes Shelly Bombard, MD  cyclobenzaprine (FLEXERIL) 10 MG tablet Take 1 tablet (10 mg total) by mouth 2 (two) times daily as needed for muscle spasms. Patient not taking: Reported on 02/23/2020 07/02/18   Dowless, Samantha Tripp, PA-C  LO LOESTRIN FE 1 MG-10 MCG / 10 MCG tablet Take 1 tablet by mouth daily. Patient not taking: Reported on 08/27/2016 06/15/16   Shelly Bombard, MD  naproxen (NAPROSYN) 375 MG tablet Take 1 tablet (375 mg total) by mouth 2 (two) times daily. Patient not taking: Reported on 08/26/2019 07/02/18   Dowless, Dondra Spry, PA-C  prenatal vitamin w/FE, FA (PRENATAL 1 + 1) 27-1 MG TABS tablet Take 1 tablet by mouth daily at 12 noon. Patient not taking: Reported on 07/02/2018 08/27/16   Shelly Bombard, MD  tinidazole [facility]) 500 MG tablet Take 2 tablets (1,000 mg total) by mouth daily with breakfast. Patient not taking: Reported on 02/23/2020 08/28/19   Shelly Bombard, MD  Past Surgical History Past Surgical History:  Procedure Laterality Date  . NO PAST SURGERIES    . oral surgery     Family History Family History  Problem Relation Age of Onset  . Heart disease Mother   . Bronchitis Mother   . Asthma Other   . Hypertension Other   . Diabetes Brother     Social History Social History   Tobacco Use  . Smoking status: Never Smoker  . Smokeless tobacco: Never Used  Substance Use Topics  . Alcohol use: No    Alcohol/week: 0.0 standard drinks  . Drug use:  No   Allergies Patient has no known allergies.  Review of Systems Review of Systems All other systems are reviewed and are negative for acute change except as noted in the HPI  Physical Exam Vital Signs  I have reviewed the triage vital signs BP (!) 188/94 (BP Location: Left Arm)   Pulse 60   Temp 98.4 F (36.9 C) (Oral)   Resp 15   Ht 5\' 6"  (1.676 m)   Wt 94.8 kg   LMP 02/05/2020 (LMP Unknown)   SpO2 100%   BMI 33.73 kg/m   Physical Exam Vitals reviewed.  Constitutional:      General: She is not in acute distress.    Appearance: She is well-developed. She is not diaphoretic.  HENT:     Head: Normocephalic and atraumatic.     Nose: Nose normal.  Eyes:     General: No scleral icterus.       Right eye: No discharge.        Left eye: No discharge.     Conjunctiva/sclera: Conjunctivae normal.     Pupils: Pupils are equal, round, and reactive to light.  Cardiovascular:     Rate and Rhythm: Normal rate and regular rhythm.     Heart sounds: No murmur heard.  No friction rub. No gallop.   Pulmonary:     Effort: Pulmonary effort is normal. No respiratory distress.     Breath sounds: Normal breath sounds. No stridor. No rales.  Abdominal:     General: There is no distension.     Palpations: Abdomen is soft.     Tenderness: There is no abdominal tenderness.  Musculoskeletal:        General: No tenderness.     Cervical back: Normal range of motion and neck supple.  Skin:    General: Skin is warm and dry.     Findings: No erythema or rash.  Neurological:     Mental Status: She is alert and oriented to person, place, and time.     ED Results and Treatments Labs (all labs ordered are listed, but only abnormal results are displayed) Labs Reviewed  CBC - Abnormal; Notable for the following components:      Result Value   Hemoglobin 10.9 (*)    HCT 34.5 (*)    RDW 16.5 (*)    All other components within normal limits  URINALYSIS, ROUTINE W REFLEX MICROSCOPIC -  Abnormal; Notable for the following components:   Color, Urine COLORLESS (*)    Specific Gravity, Urine 1.001 (*)    Hgb urine dipstick SMALL (*)    All other components within normal limits  BASIC METABOLIC PANEL  CBG MONITORING, ED  I-STAT BETA HCG BLOOD, ED ([facility], [facility], [facility] ONLY)  EKG  EKG Interpretation  Date/Time:  Tuesday February 23 2020 01:14:57 EDT Ventricular Rate:  56 PR Interval:    QRS Duration: 86 QT Interval:  465 QTC Calculation: 449 R Axis:   86 Text Interpretation: Sinus rhythm 12 Lead; Mason-Likar since last tracing no significant change Confirmed by Addison Lank [phone]) on 02/23/2020 2:14:12 AM      Radiology No results found.  Pertinent labs & imaging results that were available during my care of the patient were reviewed by me and considered in my medical decision making (see chart for details).  Medications Ordered in ED Medications - No data to display                                                                                                                                  Procedures Procedures  (including critical care time)  Medical Decision Making / ED Course I have reviewed the nursing notes for this encounter and the patient's prior records (if available in EHR or on provided paperwork).   [name] was evaluated in Emergency Department on 02/23/2020 for the symptoms described in the history of present illness. She was evaluated in the context of the global COVID-19 pandemic, which necessitated consideration that the patient might be at risk for infection with the SARS-CoV-2 virus that causes COVID-19. Institutional protocols and algorithms that pertain to the evaluation of patients at risk for COVID-19 are in a state of rapid change based on information released by regulatory bodies including the CDC and federal and state  organizations. These policies and algorithms were followed during the patient's care in the ED.  Patient presents with near syncope upon standing from sitting position. EKG without acute ischemic changes, dysrhythmias, blocks, evidence of Brugada, HOCM. Blood work is reassuring without leukocytosis.  Stable anemia.  No significant electrolyte derangements or renal sufficiency.  Beta-hCG negative. Orthostatics were reassuring.  With regards to patient's headache, her exam is non focal neuro exam. Currently HA free. No recent head trauma. No fever. Doubt meningitis. Doubt intracranial bleed. Doubt IIH. No indication for imaging.      Final Clinical Impression(s) / ED Diagnoses Final diagnoses:  Near syncope   The patient appears reasonably screened and/or stabilized for discharge and I doubt any other medical condition or other [facility] requiring further screening, evaluation, or treatment in the ED at this time prior to discharge. Safe for discharge with strict return precautions.  Disposition: Discharge  Condition: Good  I have discussed the results, Dx and Tx plan with the patient/family who expressed understanding and agree(s) with the plan. Discharge instructions discussed at length. The patient/family was given strict return precautions who verbalized understanding of the instructions. No further questions at time of discharge.    ED Discharge Orders    None       Follow Up: Benito Mccreedy, MD [address] [place] [address] [phone]  Schedule an appointment  as soon as possible for a visit        This chart was dictated using voice recognition software.  Despite best efforts to proofread,  errors can occur which can change the documentation meaning.   Fatima Blank, MD 02/23/20 [phone]

## 2020-02-23 NOTE — ED Triage Notes (Signed)
Patient states around 2230, patient was getting up from dinner table. Patient states that she had sharp pain in her head and felt like she was about to pass out.

## 2020-03-16 ENCOUNTER — Encounter: Payer: Self-pay | Admitting: Obstetrics

## 2020-03-16 ENCOUNTER — Telehealth (INDEPENDENT_AMBULATORY_CARE_PROVIDER_SITE_OTHER): Payer: Medicaid Other | Admitting: Obstetrics

## 2020-03-16 DIAGNOSIS — D251 Intramural leiomyoma of uterus: Secondary | ICD-10-CM

## 2020-03-16 DIAGNOSIS — E669 Obesity, unspecified: Secondary | ICD-10-CM | POA: Diagnosis not present

## 2020-03-16 DIAGNOSIS — N939 Abnormal uterine and vaginal bleeding, unspecified: Secondary | ICD-10-CM | POA: Diagnosis not present

## 2020-03-16 DIAGNOSIS — N946 Dysmenorrhea, unspecified: Secondary | ICD-10-CM | POA: Diagnosis not present

## 2020-03-16 NOTE — Progress Notes (Signed)
TELEHEALTH GYNECOLOGY VISIT ENCOUNTER NOTE  I connected with [name] on 03/16/20 at  3:45 PM EDT by telephone at home and verified that I am speaking with the correct person using two identifiers.   I discussed the limitations, risks, security and privacy concerns of performing an evaluation and management service by telephone and the availability of in person appointments. I also discussed with the patient that there may be a patient responsible charge related to this service. The patient expressed understanding and agreed to proceed.   History:  [name] is a 44 y.o. G34P0020 female being evaluated today for results of endometrial biopsy. She has a history of AUB and uterine fibroids.  She denies any abnormal vaginal discharge, bleeding, pelvic pain or other concerns.       Past Medical History:  Diagnosis Date  . Anxiety   . Bronchitis   . Depressed   . Heart murmur   . Hypertension   . Strep pharyngitis    Past Surgical History:  Procedure Laterality Date  . NO PAST SURGERIES    . oral surgery     The following portions of the patient's history were reviewed and updated as appropriate: allergies, current medications, past family history, past medical history, past social history, past surgical history and problem list.   Health Maintenance:  Normal pap and negative HRHPV on 08-26-2019.  Normal mammogram on 09-22-2019.   Review of Systems:  Pertinent items noted in HPI and remainder of comprehensive ROS otherwise negative.  Physical Exam:   General:  Alert, oriented and cooperative.   Mental Status: Normal mood and affect perceived. Normal judgment and thought content.  Physical exam deferred due to nature of the encounter  Labs and Imaging SURGICAL PATHOLOGY Surgical pathology( [place]/ [place]) Collected: 02/10/20 1557  Lab status: Final-Edited  Resulting lab: [facility]  Value: SURGICAL PATHOLOGY  CASE: [id number]  PATIENT:  [name]  Surgical Pathology Report      Clinical History: AUB, intramural fibroids, dysmenorrhea (cm)      FINAL MICROSCOPIC DIAGNOSIS:   A. ENDOMETRIUM, BIOPSY:  - Endometrium with progestin effect, polypoid, benign.    GROSS DESCRIPTION:   Surfaces received in formalin are tan, hemorrhagic soft tissue fragments  that are entirely submitted.  Volume: 2.5 x 2 x 0.3 cm. (1 B) (AK 02/11/2020)     Final Diagnosis performed by Gillie Manners, MD.  Electronically  signed 02/12/2020  Technical and / or Professional components performed at [facility]. [facility], [place] [address] [place], [place] [address].  Immunohistochemistry Technical component (if applicable) was performed  at [facility]. [address] [place],  [place], [place] [address].  IMMUNOHISTOCHEMISTRY DISCLAIMER (if applicable):  Some of these immunohistochemical stains may have been developed and the  performance characteristics determine by [facility]. Some  may not have been cleared or approved by the U.S. Food and Drug  Administration. The FDA has determined that such clearance or approval  is not necessary. This test is used for clinical purposes. It should not  be regarded as investigational or for research. This laboratory is  certified under the [place]  (CLIA-88) as qualified to perform high complexity clinical laboratory  testing. The controls stained appropriately.   Results Surgical pathology( [place]/ [facility]) (Order [id number]) MyChart Results Release  MyChart Status: Active Results Release  Surgical pathology( / [facility]) Order: [id number] Status:  Edited Result - FINAL Visible to  patient:  Yes (seen) Next appt:  None Dx:  Abnormal uterine bleeding (AUB); Dysm...     Assessment and Plan:     1. Abnormal uterine bleeding (AUB) - stable clinically  2. Fibroids,  intramural - stable clinically  3. Dysmenorrhea - Ibuprofen prn  4. Obesity (BMI 30.0-34.9) - program of caloric reduction, exercise and behavioral modification recommended       I discussed the assessment and treatment plan with the patient. The patient was provided an opportunity to ask questions and all were answered. The patient agreed with the plan and demonstrated an understanding of the instructions.   The patient was advised to call back or seek an in-person evaluation/go to the ED if the symptoms worsen or if the condition fails to improve as anticipated.  I provided 15 minutes of non-face-to-face time during this encounter.   Baltazar Najjar, MD [facility] [place] [facility] 03/16/20

## 2020-04-26 ENCOUNTER — Other Ambulatory Visit: Payer: Self-pay | Admitting: Obstetrics

## 2020-04-26 DIAGNOSIS — B9689 Other specified bacterial agents as the cause of diseases classified elsewhere: Secondary | ICD-10-CM

## 2020-04-26 DIAGNOSIS — N760 Acute vaginitis: Secondary | ICD-10-CM

## 2020-05-26 ENCOUNTER — Emergency Department (HOSPITAL_COMMUNITY)
Admission: EM | Admit: 2020-05-26 | Discharge: 2020-05-26 | Disposition: A | Discharge status: Home / Self Care | Payer: Medicaid Other | Attending: Emergency Medicine | Admitting: Emergency Medicine

## 2020-05-26 ENCOUNTER — Other Ambulatory Visit: Payer: Self-pay

## 2020-05-26 ENCOUNTER — Encounter (HOSPITAL_COMMUNITY): Payer: Self-pay

## 2020-05-26 DIAGNOSIS — U071 COVID-19: Secondary | ICD-10-CM | POA: Diagnosis not present

## 2020-05-26 DIAGNOSIS — Z5321 Procedure and treatment not carried out due to patient leaving prior to being seen by health care provider: Secondary | ICD-10-CM | POA: Diagnosis not present

## 2020-05-26 DIAGNOSIS — R42 Dizziness and giddiness: Secondary | ICD-10-CM | POA: Diagnosis present

## 2020-05-26 NOTE — ED Triage Notes (Signed)
Patient arrived stating she has testes positive for covid-19. Reporting congestion over the last 6 days and today began feeling dizzy and that her heart was racing. Declines any chest pain or shortness of breath.

## 2020-11-01 ENCOUNTER — Other Ambulatory Visit: Payer: Self-pay

## 2020-11-01 DIAGNOSIS — N760 Acute vaginitis: Secondary | ICD-10-CM

## 2020-11-01 DIAGNOSIS — B9689 Other specified bacterial agents as the cause of diseases classified elsewhere: Secondary | ICD-10-CM

## 2020-11-03 MED ORDER — TINIDAZOLE 500 MG PO TABS: 500.0000 mg | ORAL_TABLET | Freq: Every day | ORAL | 0 refills | Status: AC

## 2020-11-21 ENCOUNTER — Emergency Department (HOSPITAL_COMMUNITY)
Admission: EM | Admit: 2020-11-21 | Discharge: 2020-11-22 | Disposition: A | Discharge status: Home / Self Care | Payer: Medicaid Other | Attending: Emergency Medicine | Admitting: Emergency Medicine

## 2020-11-21 ENCOUNTER — Emergency Department (HOSPITAL_COMMUNITY): Payer: Medicaid Other

## 2020-11-21 DIAGNOSIS — N9489 Other specified conditions associated with female genital organs and menstrual cycle: Secondary | ICD-10-CM | POA: Insufficient documentation

## 2020-11-21 DIAGNOSIS — R42 Dizziness and giddiness: Secondary | ICD-10-CM | POA: Diagnosis not present

## 2020-11-21 DIAGNOSIS — Z79899 Other long term (current) drug therapy: Secondary | ICD-10-CM | POA: Diagnosis not present

## 2020-11-21 DIAGNOSIS — N3000 Acute cystitis without hematuria: Secondary | ICD-10-CM | POA: Diagnosis not present

## 2020-11-21 DIAGNOSIS — I10 Essential (primary) hypertension: Secondary | ICD-10-CM | POA: Diagnosis not present

## 2020-11-21 DIAGNOSIS — R1031 Right lower quadrant pain: Secondary | ICD-10-CM | POA: Diagnosis present

## 2020-11-21 DIAGNOSIS — R002 Palpitations: Secondary | ICD-10-CM | POA: Diagnosis not present

## 2020-11-21 LAB — CBC WITH DIFFERENTIAL/PLATELET
Abs Immature Granulocytes: 0.03 10*3/uL (ref 0.00–0.07)
Basophils Absolute: 0 10*3/uL (ref 0.0–0.1)
Basophils Relative: 0 %
Eosinophils Absolute: 0.1 10*3/uL (ref 0.0–0.5)
Eosinophils Relative: 1 %
HCT: 39.5 % (ref 36.0–46.0)
Hemoglobin: 12.9 g/dL (ref 12.0–15.0)
Immature Granulocytes: 0 %
Lymphocytes Relative: 15 %
Lymphs Abs: 1.3 10*3/uL (ref 0.7–4.0)
MCH: 30.3 pg (ref 26.0–34.0)
MCHC: 32.7 g/dL (ref 30.0–36.0)
MCV: 92.7 fL (ref 80.0–100.0)
Monocytes Absolute: 1.4 10*3/uL — ABNORMAL HIGH (ref 0.1–1.0)
Monocytes Relative: 16 %
Neutro Abs: 5.8 10*3/uL (ref 1.7–7.7)
Neutrophils Relative %: 68 %
Platelets: 280 10*3/uL (ref 150–400)
RBC: 4.26 MIL/uL (ref 3.87–5.11)
RDW: 12.4 % (ref 11.5–15.5)
WBC: 8.6 10*3/uL (ref 4.0–10.5)
nRBC: 0 % (ref 0.0–0.2)

## 2020-11-21 LAB — URINALYSIS, ROUTINE W REFLEX MICROSCOPIC
Bilirubin Urine: NEGATIVE
Glucose, UA: NEGATIVE mg/dL
Ketones, ur: 20 mg/dL — AB
Nitrite: NEGATIVE
Protein, ur: NEGATIVE mg/dL
Specific Gravity, Urine: 1.008 (ref 1.005–1.030)
pH: 7 (ref 5.0–8.0)

## 2020-11-21 LAB — COMPREHENSIVE METABOLIC PANEL
ALT: 26 U/L (ref 0–44)
AST: 22 U/L (ref 15–41)
Albumin: 3.5 g/dL (ref 3.5–5.0)
Alkaline Phosphatase: 64 U/L (ref 38–126)
Anion gap: 6 (ref 5–15)
BUN: <5 mg/dL — ABNORMAL LOW (ref 6–20)
CO2: 25 mmol/L (ref 22–32)
Calcium: 8.9 mg/dL (ref 8.9–10.3)
Chloride: 103 mmol/L (ref 98–111)
Creatinine, Ser: 0.87 mg/dL (ref 0.44–1.00)
GFR, Estimated: >60 mL/min (ref >60)
Glucose, Bld: 91 mg/dL (ref 70–99)
Potassium: 3.4 mmol/L — ABNORMAL LOW (ref 3.5–5.1)
Sodium: 134 mmol/L — ABNORMAL LOW (ref 135–145)
Total Bilirubin: 0.6 mg/dL (ref 0.3–1.2)
Total Protein: 8.2 g/dL — ABNORMAL HIGH (ref 6.5–8.1)

## 2020-11-21 LAB — I-STAT BETA HCG BLOOD, ED (MC, WL, AP ONLY): I-stat hCG, quantitative: <5 m[IU]/mL (ref <5)

## 2020-11-21 LAB — LIPASE, BLOOD: Lipase: 39 U/L (ref 11–51)

## 2020-11-21 LAB — MAGNESIUM: Magnesium: 1.8 mg/dL (ref 1.7–2.4)

## 2020-11-21 NOTE — ED Triage Notes (Signed)
Pt c/o CP/palpitations, dizziness off & on x12mos. States she is also not peeing like she should be. 9/10 "hurts" LRQ abd

## 2020-11-21 NOTE — ED Provider Notes (Signed)
Emergency Medicine Provider Triage Evaluation Note  [name] , a 45 y.o. female  was evaluated in triage.  Pt has multiple complaints.  She has been experiencing palpitations 2-3 x per day for the past year. Typically occur when lying down and improve with activity. Mild chest tightness when they occur. No SOB. No n/v.  Pt notes feeling "off balance" for about 2-3 hours. No modifying factors. No syncope. Denies the room is spinning.   Pt also complains of right sided abdominal pain and decreased urination for the past 2-3 days.   Physical Exam  BP 134/89 (BP Location: Right Arm)   Pulse 96   Temp 97.9 F (36.6 C)   Resp 17   SpO2 98%  Gen:   Awake, no distress   Resp:  Normal effort  MSK:   Moves extremities without difficulty  Other:    Medical Decision Making  Medically screening exam initiated at 4:15 PM.  Appropriate orders placed.  [name] was informed that the remainder of the evaluation will be completed by another provider, this initial triage assessment does not replace that evaluation, and the importance of remaining in the ED until their evaluation is complete.   Cindy Sexton, PA-C 11/21/20 1617    Lucrezia Starch, MD 11/22/20 1537

## 2020-11-22 ENCOUNTER — Other Ambulatory Visit: Payer: Self-pay

## 2020-11-22 ENCOUNTER — Emergency Department (HOSPITAL_COMMUNITY): Payer: Medicaid Other

## 2020-11-22 MED ORDER — CEPHALEXIN 250 MG PO CAPS
500.0000 mg | ORAL_CAPSULE | Freq: Once | ORAL | Status: AC
  Administered 2020-11-22: 500 mg via ORAL
  Filled 2020-11-22: qty 2

## 2020-11-22 MED ORDER — CEPHALEXIN 500 MG PO CAPS: 500.0000 mg | ORAL_CAPSULE | Freq: Two times a day (BID) | ORAL | 0 refills | Status: DC

## 2020-11-22 MED ORDER — IOHEXOL 300 MG/ML  SOLN
100.0000 mL | Freq: Once | INTRAMUSCULAR | Status: AC | PRN
  Administered 2020-11-22: 100 mL via INTRAVENOUS

## 2020-11-22 MED ORDER — FENTANYL CITRATE (PF) 100 MCG/2ML IJ SOLN
50.0000 ug | Freq: Once | INTRAMUSCULAR | Status: AC
  Administered 2020-11-22: 50 ug via INTRAVENOUS
  Filled 2020-11-22: qty 2

## 2020-11-22 NOTE — ED Provider Notes (Signed)
[facility] EMERGENCY DEPARTMENT Provider Note   CSN: [id number] Arrival date & time: 11/21/20  1544     History Chief Complaint  Patient presents with   Chest Pain   Dizziness   Palpitations   Abdominal Pain    [name] is a 45 y.o. female.  Patient presents to the emergency department with a chief complaint of right lower quadrant abdominal pain.  She states that the symptoms have been worsening over the past 2 to 3 days.  She reports associated decreased urination.  She denies dysuria or hematuria.  She reports history of frequent UTI, but states that this feels different.  Additionally, patient also complains of some palpitations which have been ongoing for about a year.  She also reports slight dizziness, but denies any numbness, weakness, or tingling.  The history is provided by the patient. No language interpreter was used.      Past Medical History:  Diagnosis Date   Anxiety    Bronchitis    Depressed    Heart murmur    Hypertension    Strep pharyngitis     Patient Active Problem List   Diagnosis Date Noted   Osteoarthritis of ankle and foot 05/24/2015   Coalition, tarsal 05/24/2015   Fibroid, uterine 01/27/2015   Unspecified disorder of menstruation and other abnormal bleeding from female genital tract 01/22/2014   Dysmenorrhea 01/22/2014   Cervical high risk human papillomavirus (HPV) DNA test positive 03/12/2013   Vaginitis and vulvovaginitis, unspecified 09/03/2012    Past Surgical History:  Procedure Laterality Date   NO PAST SURGERIES     oral surgery       OB History     Gravida  2   Para  0   Term      Preterm      AB  2   Living  0      SAB  1   IAB  1   Ectopic      Multiple      Live Births              Family History  Problem Relation Age of Onset   Heart disease Mother    Bronchitis Mother    Asthma Other    Hypertension Other    Diabetes Brother     Social History   Tobacco Use    Smoking status: Never   Smokeless tobacco: Never  Substance Use Topics   Alcohol use: No    Alcohol/week: 0.0 standard drinks   Drug use: No    Home Medications Prior to Admission medications   Medication Sig Start Date End Date Taking? Authorizing Provider  cyclobenzaprine (FLEXERIL) 10 MG tablet Take 1 tablet (10 mg total) by mouth 2 (two) times daily as needed for muscle spasms. Patient not taking: Reported on 02/23/2020 07/02/18   Dowless, Aldona Bar Tripp, PA-C  ibuprofen (ADVIL) 800 MG tablet Take 1 tablet (800 mg total) by mouth every 8 (eight) hours as needed. 02/09/20   Shelly Bombard, MD  LO LOESTRIN FE 1 MG-10 MCG / 10 MCG tablet Take 1 tablet by mouth daily. Patient not taking: Reported on 08/27/2016 06/15/16   Shelly Bombard, MD  metoprolol tartrate (LOPRESSOR) 25 MG tablet Take 25 mg by mouth 2 (two) times daily. 01/26/20   [provider]  naproxen (NAPROSYN) 375 MG tablet Take 1 tablet (375 mg total) by mouth 2 (two) times daily. Patient not taking: Reported on 08/26/2019 07/02/18  Dowless, Samantha Tripp, PA-C  Prenat-Fe Poly-Methfol-FA-DHA (VITAFOL ULTRA) 29-0.6-0.4-200 MG CAPS Take 1 capsule by mouth daily before breakfast. 08/26/19   Shelly Bombard, MD  prenatal vitamin w/FE, FA (PRENATAL 1 + 1) 27-1 MG TABS tablet Take 1 tablet by mouth daily at 12 noon. Patient not taking: Reported on 07/02/2018 08/27/16   Shelly Bombard, MD    Allergies    Patient has no known allergies.  Review of Systems   Review of Systems  All other systems reviewed and are negative.  Physical Exam Updated Vital Signs BP (!) 134/96 (BP Location: Left Arm)   Pulse 88   Temp 98.5 F (36.9 C)   Resp 16   SpO2 100%   Physical Exam Vitals and nursing note reviewed.  Constitutional:      General: She is not in acute distress.    Appearance: She is well-developed.  HENT:     Head: Normocephalic and atraumatic.  Eyes:     Conjunctiva/sclera: Conjunctivae normal.   Cardiovascular:     Rate and Rhythm: Normal rate and regular rhythm.     Heart sounds: No murmur heard. Pulmonary:     Effort: Pulmonary effort is normal. No respiratory distress.     Breath sounds: Normal breath sounds.  Abdominal:     Palpations: Abdomen is soft.     Tenderness: There is abdominal tenderness.     Comments: RLQ TTP  Musculoskeletal:     Cervical back: Neck supple.  Skin:    General: Skin is warm and dry.  Neurological:     Mental Status: She is alert and oriented to person, place, and time.  Psychiatric:        Mood and Affect: Mood normal.        Behavior: Behavior normal.    ED Results / Procedures / Treatments   Labs (all labs ordered are listed, but only abnormal results are displayed) Labs Reviewed  COMPREHENSIVE METABOLIC PANEL - Abnormal; Notable for the following components:      Result Value   Sodium 134 (*)    Potassium 3.4 (*)    BUN <5 (*)    Total Protein 8.2 (*)    All other components within normal limits  CBC WITH DIFFERENTIAL/PLATELET - Abnormal; Notable for the following components:   Monocytes Absolute 1.4 (*)    All other components within normal limits  URINALYSIS, ROUTINE W REFLEX MICROSCOPIC - Abnormal; Notable for the following components:   APPearance HAZY (*)    Hgb urine dipstick SMALL (*)    Ketones, ur 20 (*)    Leukocytes,Ua MODERATE (*)    Bacteria, UA RARE (*)    All other components within normal limits  MAGNESIUM  LIPASE, BLOOD  I-STAT BETA HCG BLOOD, ED ([facility], [facility], [facility] ONLY)    EKG None  Radiology DG Chest Portable 1 View  Result Date: 11/21/2020 CLINICAL DATA:  Chest tightness EXAM: PORTABLE CHEST 1 VIEW COMPARISON:  Portable exam 1715 hours compared to 07/01/2018 FINDINGS: Upper normal size of cardiac silhouette. Mediastinal contours and pulmonary vascularity normal. Lungs clear. No infiltrate, pleural effusion, or pneumothorax. Osseous structures unremarkable. IMPRESSION: No acute abnormalities. Electronically  Signed   By: Lavonia Dana M.D.   On: 11/21/2020 18:26    Procedures Procedures   Medications Ordered in ED Medications  fentaNYL (SUBLIMAZE) injection 50 mcg (has no administration in time range)    ED Course  I have reviewed the triage vital signs and the nursing notes.  Pertinent  labs & imaging results that were available during my care of the patient were reviewed by me and considered in my medical decision making (see chart for details).    MDM Rules/Calculators/A&P                          This patient complains of RLQ abdominal pain, this involves an extensive number of treatment options, and is a complaint that carries with it a high risk of complications and morbidity.    She also complains of palpitations and dizziness, but states this has been going on for a long time.  Her primary concern is the lower abdominal pain.  Differential Dx UTI, appy, KS  Pertinent Labs UA concerning for infection No leukocytosis Na 134 K 3.4  Imaging Interpretation I ordered imaging studies which included CT ab/pel, which showed fibroid uterus, mild bladder wall thickening, cholelithiasis without evidence of cholecystitis.   Plan Discharge with treatment for UTI.  Given reassuring labs and CT, suspect symptoms are from UTI.  Patient has hx of the same.  UA and CT are consistent with this, no acute surgical process identified.  VSS.  Patient appears stable for discharge.   Final Clinical Impression(s) / ED Diagnoses Final diagnoses:  Acute cystitis without hematuria    Rx / DC Orders ED Discharge Orders     None        Montine Circle, PA-C 11/22/20 0548    Maudie Flakes, MD 11/22/20 [phone]

## 2020-11-22 NOTE — Discharge Instructions (Addendum)
Your CT scans showed multiple fibroids in your uterus.  Please discuss with  your doctor.  It also showed gallstones, but this isn't thought to be associated with your symptoms tonight.    You have been diagnosed with a bladder infection.  Take antibiotics as directed.  Return for new or worsening symptoms.   Your CT scan results are listed below.  Please discuss with your doctor.   CLINICAL DATA:  Right lower quadrant abdominal pain   EXAM:  CT ABDOMEN AND PELVIS WITH CONTRAST   TECHNIQUE:  Multidetector CT imaging of the abdomen and pelvis was performed  using the standard protocol following bolus administration of  intravenous contrast.   CONTRAST:  156mL OMNIPAQUE IOHEXOL 300 MG/ML  SOLN   COMPARISON:  Radiograph 06/05/2010   FINDINGS:  Lower chest: Lung bases are clear. Normal heart size. No pericardial  effusion.   Hepatobiliary: No worrisome focal liver lesions. Smooth liver  surface contour. Normal hepatic attenuation. multiple calcified  pneumatized gallstones are seen within the otherwise normal  gallbladder. No pericholecystic fluid or inflammation. No biliary  ductal dilatation or visible intraductal gallstones.   Pancreas: No pancreatic ductal dilatation or surrounding  inflammatory changes.   Spleen: Normal in size. No concerning splenic lesions.   Adrenals/Urinary Tract: Normal adrenals. Kidneys are normally  located with symmetric enhancementand excretion. No suspicious  renal lesion, urolithiasis or hydronephrosis. Question some mild  bladder wall thickening accounting for the degree of distention.   Stomach/Bowel: Small amount of fluid in the distal thoracic  esophagus can reflect some mild reflux. Stomach and duodenum are  unremarkable. No small bowel thickening or dilatation. A normal  appendix is visualized. No colonic dilatation or wall thickening.   Vascular/Lymphatic: No significant vascular findings are present. No  enlarged abdominal or pelvic  lymph nodes.   Reproductive: Anteverted uterus. Heterogeneous dorsal fibroid along  the fundus and uterine body. No concerning ovarian/adnexal masses  are evident.   Other: Trace free fluid in the deep pelvis, nonspecific in a  reproductive age female. No free air. No bowel containing hernia.   Musculoskeletal: No acute osseous abnormality or suspicious osseous  lesion.   IMPRESSION:  1. Leiomyomatous uterus, consider further characterization with  outpatient pelvic ultrasound.  2. Question some mild bladder wall thickening given the degree of  distention. Correlate for urinary symptoms and with urinalysis where  appropriate.  3. Cholelithiasis with numerous calcified pneumatized gallstones. No  evidence of acute cholecystitis or biliary dilatation.  4. Small amount of fluid in the distal thoracic esophagus, correlate  for clinical features of esophageal reflux.  5. Normal appendix.

## 2021-01-31 ENCOUNTER — Ambulatory Visit: Payer: Self-pay | Admitting: General Surgery

## 2021-01-31 DIAGNOSIS — K8010 Calculus of gallbladder with chronic cholecystitis without obstruction: Secondary | ICD-10-CM | POA: Insufficient documentation

## 2021-03-14 ENCOUNTER — Other Ambulatory Visit: Payer: Self-pay

## 2021-03-14 ENCOUNTER — Encounter (HOSPITAL_COMMUNITY): Payer: Self-pay | Admitting: General Surgery

## 2021-03-14 NOTE — Progress Notes (Addendum)
Ms [name] denies chest pain or shortness of breath. Patient denies having any s/s of Covid in her household.  Patient denies any known exposure to Covid.   Ms [name] has HTN, patient was taking Metoprolol, but patient thought it made her heart rate speed up. Ms [name] is now on HCTZ for HTN. Ms [name] reports she feels like her heart is fluttering and beating really fast. This can happen when she is sitting,or stand or lying in the bed.  In the bed it only happen when Ms [name] is lying on her left side and she becomes short of breath.The fluttering last about 3 minutes when patient is in the bed, she turns to her right side and the fluttering subsides.  When Ms [name] is sitting or standing it does not last as long and she does not become short of breath. Ms [name] reports that this happened was 1 week ago.  Patient has been happing these episodes since summertime. Ms [name] has not reported the problem to a MD, patient was seen in PCPs office in October 2022. PCP is Dr. Emilee Hero- Bonsu. I am asking a PA-C for anesthesia to review chart.   I instructed patient to shower with antibiotic soap, if it is available.  Dry off with a clean towel. Do not put lotion, powder, cologne or deodorant or makeup.No jewelry or piercings. Men may shave their face and neck. Woman should not shave. No nail polish, artificial or acrylic nails. Wear clean clothes, brush your teeth. Glasses, contact lens,dentures or partials may not be worn in the OR. If you need to wear them, please bring a case for glasses, do not wear contacts or bring a case, the hospital does not have contact cases, dentures or partials will have to be removed , make sure they are clean, we will provide a denture cup to put them in. You will need some one to drive you home and a responsible person over the age of 21 to stay with you for the first 24 hours after surgery.    Ms [name] states that she had a sleep study, sometime this year ,  somewhere in [place]. Patient reports that it was negative.

## 2021-03-15 ENCOUNTER — Encounter (HOSPITAL_COMMUNITY): Payer: Self-pay | Admitting: General Surgery

## 2021-03-15 NOTE — Anesthesia Preprocedure Evaluation (Addendum)
Anesthesia Evaluation  Patient identified by MRN, date of birth, ID band Patient awake    Reviewed: Allergy & Precautions, NPO status , Patient's Chart, lab work & pertinent test results  History of Anesthesia Complications Negative for: history of anesthetic complications  Airway Mallampati: II  TM Distance: >3 FB Neck ROM: Full    Dental  (+) Teeth Intact, Dental Advisory Given   Pulmonary neg pulmonary ROS,    Pulmonary exam normal        Cardiovascular hypertension, Pt. on medications and Pt. on home beta blockers Normal cardiovascular exam     Neuro/Psych Anxiety Depression negative neurological ROS     GI/Hepatic negative GI ROS, Neg liver ROS,   Endo/Other  negative endocrine ROS  Renal/GU negative Renal ROS  negative genitourinary   Musculoskeletal negative musculoskeletal ROS (+)   Abdominal   Peds  Hematology negative hematology ROS (+)   Anesthesia Other Findings   Reproductive/Obstetrics fibroids                            Anesthesia Physical Anesthesia Plan  ASA: 2  Anesthesia Plan: General   Post-op Pain Management:    Induction: Intravenous  PONV Risk Score and Plan: 4 or greater and Ondansetron, Dexamethasone, Treatment may vary due to age or medical condition and Midazolam  Airway Management Planned: Oral ETT  Additional Equipment: None  Intra-op Plan:   Post-operative Plan: Extubation in OR  Informed Consent: I have reviewed the patients History and Physical, chart, labs and discussed the procedure including the risks, benefits and alternatives for the proposed anesthesia with the patient or authorized representative who has indicated his/her understanding and acceptance.     Dental advisory given  Plan Discussed with:   Anesthesia Plan Comments: (PAT note written 03/15/2021 by Myra Gianotti, PA-C. )       Anesthesia Quick Evaluation

## 2021-03-15 NOTE — Progress Notes (Signed)
Anesthesia Chart Review: SAME DAY WORK-UP  Case: [id number] Date/Time: 03/16/21 1015   Procedure: LAPAROSCOPIC CHOLECYSTECTOMY WITH INTRAOPERATIVE CHOLANGIOGRAM - 13 MINUTES   Anesthesia type: General   Pre-op diagnosis: GALLSTONES   Location: [facility] OR ROOM 09 / [facility]   Surgeons: Jovita Kussmaul, MD       DISCUSSION: Patient is a 45 year old female scheduled for the above procedure.   History includes never smoker, HTN, anxiety, depression, palpitations, murmur, anemia, uterine fibroids.   Seen in ED 11/21/20 for RUQ abdominal pain, palpitations. EKG NSR, non-specific ST/T wave abnormality. HCG negative. CT scan showed uterine leiomyoma, mild bladder thickening, cholelithiasis with numerous gallstones, small amount of fluid in the distal esophagus possibly due to reflux. UA + moderate leukocytes, negative nitrites, rare bacteria and treated for UTI. She was also eventually referred to general surgery for possibly symptomatic gallstones.  Reported history of murmur, diagnosed as a teenager. Notes indicate that she denied prior echo or cardiology evaluation. She is a same day work-up, so will have to be further evaluated on the day of surgery by her anesthesia team. 01/31/21 note by Dr. Marlou Starks documented, "Normal heart sounds", and "No murmur heard" was documented by the ED provider on 11/22/20. She denied SOB and chest pain by PAT RN phone interview. She did report intermittent chest fluttering that started around summer 2022 and only occurs when she lies on her left side. Fluttering can last up to 3 minutes, and subsides after repositioning to the right. Last episode ~ 1 week ago. No syncope. EKG showed NSR with no ectopy 11/21/20.    She reported a negative sleep study somewhere in [place] within the past year.   Labs as indicated on arrival including urine pregnancy test.    VS: Ht 5\' 5"  (1.651 m)   Wt 93.4 kg   LMP 03/10/2021   BMI 34.28 kg/m  BP Readings from Last 3 Encounters:  11/22/20  140/86  05/26/20 (!) 163/98  02/23/20 (!) 156/95   Pulse Readings from Last 3 Encounters:  11/22/20 76  05/26/20 72  02/23/20 (!) 54      PROVIDERS: Benito Mccreedy, MD is PCP    LABS: For day of surgery as indicated. As of 11/21/20, Cr 0.87, AST 22, ALT 26, glucose 91, CBC normal.   IMAGES: CT Abd/pelvis 11/22/20: IMPRESSION: 1. Leiomyomatous uterus, consider further characterization with outpatient pelvic ultrasound. 2. Question some mild bladder wall thickening given the degree of distention. Correlate for urinary symptoms and with urinalysis where appropriate. 3. Cholelithiasis with numerous calcified pneumatized gallstones. No evidence of acute cholecystitis or biliary dilatation. 4. Small amount of fluid in the distal thoracic esophagus, correlate for clinical features of esophageal reflux. 5. Normal appendix.  MRI Head 09/27/20 ([facility]): Impression: Normal   EKG: 11/21/20: Normal sinus rhythm Nonspecific ST and T wave abnormality Abnormal ECG   CV: N/A  Past Medical History:  Diagnosis Date   Anemia    Anxiety    Arthritis    right foot   Bronchitis    Depressed    Family history of adverse reaction to anesthesia    twin sister - N/V   Heart murmur    was informed of this when she was a teen, has not seen a cardiologist or had an ECHO   Hypertension    Strep pharyngitis     Past Surgical History:  Procedure Laterality Date   NO PAST SURGERIES     oral surgery  MEDICATIONS: No current facility-administered medications for this encounter.    cephALEXin (KEFLEX) 500 MG capsule   cyclobenzaprine (FLEXERIL) 10 MG tablet   escitalopram (LEXAPRO) 10 MG tablet   hydrochlorothiazide (HYDRODIURIL) 25 MG tablet   ibuprofen (ADVIL) 800 MG tablet   LO LOESTRIN FE 1 MG-10 MCG / 10 MCG tablet   metoprolol tartrate (LOPRESSOR) 25 MG tablet   naproxen (NAPROSYN) 375 MG tablet   Prenat-Fe Poly-Methfol-FA-DHA (VITAFOL ULTRA) 29-0.6-0.4-200 MG  CAPS   prenatal vitamin w/FE, FA (PRENATAL 1 + 1) 27-1 MG TABS tablet    Myra Gianotti, PA-C Surgical Short Stay/Anesthesiology [facility] Phone [phone] [facility] Phone [phone] 03/15/2021 12:43 PM

## 2021-03-16 ENCOUNTER — Ambulatory Visit (HOSPITAL_COMMUNITY)
Admission: RE | Admit: 2021-03-16 | Discharge: 2021-03-16 | Disposition: A | Discharge status: Home / Self Care | Payer: Medicaid Other | Attending: General Surgery | Admitting: General Surgery

## 2021-03-16 ENCOUNTER — Encounter (HOSPITAL_COMMUNITY)
Admission: RE | Disposition: A | Discharge status: Home / Self Care | Payer: Self-pay | Source: Home / Self Care | Attending: General Surgery

## 2021-03-16 ENCOUNTER — Ambulatory Visit (HOSPITAL_COMMUNITY): Payer: Medicaid Other

## 2021-03-16 ENCOUNTER — Other Ambulatory Visit: Payer: Self-pay

## 2021-03-16 ENCOUNTER — Ambulatory Visit (HOSPITAL_COMMUNITY): Payer: Medicaid Other | Admitting: Vascular Surgery

## 2021-03-16 ENCOUNTER — Encounter (HOSPITAL_COMMUNITY): Payer: Self-pay | Admitting: General Surgery

## 2021-03-16 DIAGNOSIS — I10 Essential (primary) hypertension: Secondary | ICD-10-CM | POA: Insufficient documentation

## 2021-03-16 DIAGNOSIS — Z791 Long term (current) use of non-steroidal anti-inflammatories (NSAID): Secondary | ICD-10-CM | POA: Insufficient documentation

## 2021-03-16 DIAGNOSIS — Z793 Long term (current) use of hormonal contraceptives: Secondary | ICD-10-CM | POA: Insufficient documentation

## 2021-03-16 DIAGNOSIS — K8010 Calculus of gallbladder with chronic cholecystitis without obstruction: Secondary | ICD-10-CM | POA: Insufficient documentation

## 2021-03-16 DIAGNOSIS — Z79899 Other long term (current) drug therapy: Secondary | ICD-10-CM | POA: Insufficient documentation

## 2021-03-16 DIAGNOSIS — F32A Depression, unspecified: Secondary | ICD-10-CM | POA: Insufficient documentation

## 2021-03-16 DIAGNOSIS — F419 Anxiety disorder, unspecified: Secondary | ICD-10-CM | POA: Diagnosis not present

## 2021-03-16 DIAGNOSIS — Z419 Encounter for procedure for purposes other than remedying health state, unspecified: Secondary | ICD-10-CM

## 2021-03-16 DIAGNOSIS — K8080 Other cholelithiasis without obstruction: Secondary | ICD-10-CM | POA: Diagnosis present

## 2021-03-16 HISTORY — DX: Unspecified osteoarthritis, unspecified site: M19.90

## 2021-03-16 HISTORY — DX: Palpitations: R00.2

## 2021-03-16 HISTORY — DX: Family history of other specified conditions: Z84.89

## 2021-03-16 HISTORY — PX: CHOLECYSTECTOMY: SHX55

## 2021-03-16 HISTORY — DX: Anemia, unspecified: D64.9

## 2021-03-16 LAB — BASIC METABOLIC PANEL
Anion gap: 9 (ref 5–15)
BUN: 6 mg/dL (ref 6–20)
CO2: 27 mmol/L (ref 22–32)
Calcium: 8.9 mg/dL (ref 8.9–10.3)
Chloride: 99 mmol/L (ref 98–111)
Creatinine, Ser: 0.91 mg/dL (ref 0.44–1.00)
GFR, Estimated: >60 mL/min (ref >60)
Glucose, Bld: 99 mg/dL (ref 70–99)
Potassium: 3 mmol/L — ABNORMAL LOW (ref 3.5–5.1)
Sodium: 135 mmol/L (ref 135–145)

## 2021-03-16 LAB — CBC
HCT: 39.1 % (ref 36.0–46.0)
Hemoglobin: 13 g/dL (ref 12.0–15.0)
MCH: 30.9 pg (ref 26.0–34.0)
MCHC: 33.2 g/dL (ref 30.0–36.0)
MCV: 92.9 fL (ref 80.0–100.0)
Platelets: 318 10*3/uL (ref 150–400)
RBC: 4.21 MIL/uL (ref 3.87–5.11)
RDW: 12.8 % (ref 11.5–15.5)
WBC: 9.7 10*3/uL (ref 4.0–10.5)
nRBC: 0 % (ref 0.0–0.2)

## 2021-03-16 LAB — POCT PREGNANCY, URINE: Preg Test, Ur: NEGATIVE

## 2021-03-16 SURGERY — LAPAROSCOPIC CHOLECYSTECTOMY
Anesthesia: General

## 2021-03-16 MED ORDER — CEFAZOLIN SODIUM-DEXTROSE 2-4 GM/100ML-% IV SOLN
2.0000 g | INTRAVENOUS | Status: AC
  Administered 2021-03-16: 2 g via INTRAVENOUS

## 2021-03-16 MED ORDER — LACTATED RINGERS IV SOLN: INTRAVENOUS | Status: DC

## 2021-03-16 MED ORDER — BUPIVACAINE-EPINEPHRINE 0.25% -1:200000 IJ SOLN
INTRAMUSCULAR | Status: DC | PRN
  Administered 2021-03-16: 26 mL

## 2021-03-16 MED ORDER — BUPIVACAINE-EPINEPHRINE (PF) 0.25% -1:200000 IJ SOLN
INTRAMUSCULAR | Status: AC
  Filled 2021-03-16: qty 30

## 2021-03-16 MED ORDER — DEXAMETHASONE SODIUM PHOSPHATE 10 MG/ML IJ SOLN
INTRAMUSCULAR | Status: DC | PRN
Start: 2021-03-16 — End: 2021-03-16
  Administered 2021-03-16: 5 mg via INTRAVENOUS

## 2021-03-16 MED ORDER — 0.9 % SODIUM CHLORIDE (POUR BTL) OPTIME
TOPICAL | Status: DC | PRN
  Administered 2021-03-16: 1000 mL

## 2021-03-16 MED ORDER — FENTANYL CITRATE (PF) 100 MCG/2ML IJ SOLN
INTRAMUSCULAR | Status: AC
  Filled 2021-03-16: qty 2

## 2021-03-16 MED ORDER — GABAPENTIN 300 MG PO CAPS: 300.0000 mg | ORAL_CAPSULE | ORAL | Status: AC

## 2021-03-16 MED ORDER — ONDANSETRON HCL 4 MG/2ML IJ SOLN
INTRAMUSCULAR | Status: DC | PRN
  Administered 2021-03-16: 4 mg via INTRAVENOUS

## 2021-03-16 MED ORDER — FENTANYL CITRATE (PF) 250 MCG/5ML IJ SOLN
INTRAMUSCULAR | Status: AC
  Filled 2021-03-16: qty 5

## 2021-03-16 MED ORDER — PROPOFOL 10 MG/ML IV BOLUS
INTRAVENOUS | Status: DC | PRN
  Administered 2021-03-16: 150 mg via INTRAVENOUS

## 2021-03-16 MED ORDER — ROCURONIUM BROMIDE 10 MG/ML (PF) SYRINGE
PREFILLED_SYRINGE | INTRAVENOUS | Status: DC | PRN
  Administered 2021-03-16: 60 mg via INTRAVENOUS
  Administered 2021-03-16: 20 mg via INTRAVENOUS

## 2021-03-16 MED ORDER — OXYCODONE HCL 5 MG PO TABS: 5.0000 mg | ORAL_TABLET | Freq: Once | ORAL | Status: DC | PRN

## 2021-03-16 MED ORDER — ORAL CARE MOUTH RINSE: 15.0000 mL | Freq: Once | OROMUCOSAL | Status: AC

## 2021-03-16 MED ORDER — LIDOCAINE 2% (20 MG/ML) 5 ML SYRINGE
INTRAMUSCULAR | Status: DC | PRN
  Administered 2021-03-16: 80 mg via INTRAVENOUS

## 2021-03-16 MED ORDER — IOHEXOL 300 MG/ML  SOLN
INTRAMUSCULAR | Status: DC | PRN
  Administered 2021-03-16: 12 mL

## 2021-03-16 MED ORDER — CELECOXIB 200 MG PO CAPS
ORAL_CAPSULE | ORAL | Status: AC
  Administered 2021-03-16: 200 mg via ORAL
  Filled 2021-03-16: qty 1

## 2021-03-16 MED ORDER — CEFAZOLIN SODIUM-DEXTROSE 2-4 GM/100ML-% IV SOLN
INTRAVENOUS | Status: AC
  Filled 2021-03-16: qty 100

## 2021-03-16 MED ORDER — FENTANYL CITRATE (PF) 250 MCG/5ML IJ SOLN
INTRAMUSCULAR | Status: DC | PRN
  Administered 2021-03-16: 50 ug via INTRAVENOUS
  Administered 2021-03-16: 100 ug via INTRAVENOUS
  Administered 2021-03-16 (×2): 50 ug via INTRAVENOUS

## 2021-03-16 MED ORDER — PHENYLEPHRINE HCL-NACL 20-0.9 MG/250ML-% IV SOLN
INTRAVENOUS | Status: DC | PRN
  Administered 2021-03-16: 20 ug/min via INTRAVENOUS

## 2021-03-16 MED ORDER — CELECOXIB 200 MG PO CAPS: 200.0000 mg | ORAL_CAPSULE | ORAL | Status: AC

## 2021-03-16 MED ORDER — AMISULPRIDE (ANTIEMETIC) 5 MG/2ML IV SOLN: 10.0000 mg | Freq: Once | INTRAVENOUS | Status: DC | PRN

## 2021-03-16 MED ORDER — PROPOFOL 10 MG/ML IV BOLUS
INTRAVENOUS | Status: AC
  Filled 2021-03-16: qty 20

## 2021-03-16 MED ORDER — OXYCODONE HCL 5 MG/5ML PO SOLN: 5.0000 mg | Freq: Once | ORAL | Status: DC | PRN

## 2021-03-16 MED ORDER — SUGAMMADEX SODIUM 200 MG/2ML IV SOLN
INTRAVENOUS | Status: DC | PRN
  Administered 2021-03-16: 200 mg via INTRAVENOUS

## 2021-03-16 MED ORDER — MIDAZOLAM HCL 2 MG/2ML IJ SOLN
INTRAMUSCULAR | Status: DC | PRN
  Administered 2021-03-16: 2 mg via INTRAVENOUS

## 2021-03-16 MED ORDER — CHLORHEXIDINE GLUCONATE 0.12 % MT SOLN: 15.0000 mL | Freq: Once | OROMUCOSAL | Status: AC

## 2021-03-16 MED ORDER — FENTANYL CITRATE (PF) 100 MCG/2ML IJ SOLN
25.0000 ug | INTRAMUSCULAR | Status: DC | PRN
  Administered 2021-03-16 (×2): 50 ug via INTRAVENOUS

## 2021-03-16 MED ORDER — GABAPENTIN 300 MG PO CAPS
ORAL_CAPSULE | ORAL | Status: AC
  Administered 2021-03-16: 300 mg via ORAL
  Filled 2021-03-16: qty 1

## 2021-03-16 MED ORDER — SODIUM CHLORIDE 0.9 % IR SOLN
Status: DC | PRN
  Administered 2021-03-16: 1000 mL

## 2021-03-16 MED ORDER — CHLORHEXIDINE GLUCONATE 0.12 % MT SOLN
OROMUCOSAL | Status: AC
  Administered 2021-03-16: 15 mL via OROMUCOSAL
  Filled 2021-03-16: qty 15

## 2021-03-16 MED ORDER — CHLORHEXIDINE GLUCONATE CLOTH 2 % EX PADS: 6.0000 | MEDICATED_PAD | Freq: Once | CUTANEOUS | Status: DC

## 2021-03-16 MED ORDER — ONDANSETRON HCL 4 MG/2ML IJ SOLN: 4.0000 mg | Freq: Once | INTRAMUSCULAR | Status: DC | PRN

## 2021-03-16 MED ORDER — ACETAMINOPHEN 500 MG PO TABS: 1000.0000 mg | ORAL_TABLET | ORAL | Status: AC

## 2021-03-16 MED ORDER — EPHEDRINE SULFATE-NACL 50-0.9 MG/10ML-% IV SOSY
PREFILLED_SYRINGE | INTRAVENOUS | Status: DC | PRN
  Administered 2021-03-16: 10 mg via INTRAVENOUS
  Administered 2021-03-16: 15 mg via INTRAVENOUS

## 2021-03-16 MED ORDER — HYDROCODONE-ACETAMINOPHEN 5-325 MG PO TABS: 1.0000 | ORAL_TABLET | Freq: Four times a day (QID) | ORAL | 0 refills | Status: DC | PRN

## 2021-03-16 MED ORDER — ACETAMINOPHEN 500 MG PO TABS
ORAL_TABLET | ORAL | Status: AC
  Administered 2021-03-16: 1000 mg via ORAL
  Filled 2021-03-16: qty 2

## 2021-03-16 MED ORDER — MIDAZOLAM HCL 2 MG/2ML IJ SOLN
INTRAMUSCULAR | Status: AC
  Filled 2021-03-16: qty 2

## 2021-03-16 SURGICAL SUPPLY — 33 items
APPLIER CLIP 5 13 M/L LIGAMAX5 (MISCELLANEOUS) ×2
BAG COUNTER SPONGE SURGICOUNT (BAG) ×2 IMPLANT
BLADE CLIPPER SURG (BLADE) IMPLANT
CANISTER SUCT 3000ML PPV (MISCELLANEOUS) ×2 IMPLANT
CATH REDDICK CHOLANGI 4FR 50CM (CATHETERS) ×2 IMPLANT
CHLORAPREP W/TINT 26 (MISCELLANEOUS) ×2 IMPLANT
CLIP APPLIE 5 13 M/L LIGAMAX5 (MISCELLANEOUS) ×1 IMPLANT
COVER SURGICAL LIGHT HANDLE (MISCELLANEOUS) ×2 IMPLANT
DERMABOND ADVANCED (GAUZE/BANDAGES/DRESSINGS) ×1
DERMABOND ADVANCED .7 DNX12 (GAUZE/BANDAGES/DRESSINGS) ×1 IMPLANT
ELECT REM PT RETURN 9FT ADLT (ELECTROSURGICAL) ×2
ELECTRODE REM PT RTRN 9FT ADLT (ELECTROSURGICAL) ×1 IMPLANT
GLOVE SURG ENC MOIS LTX SZ7.5 (GLOVE) ×2 IMPLANT
GLOVE SURG UNDER POLY LF SZ6 (GLOVE) ×4 IMPLANT
GOWN STRL REUS W/ TWL LRG LVL3 (GOWN DISPOSABLE) ×3 IMPLANT
GOWN STRL REUS W/TWL LRG LVL3 (GOWN DISPOSABLE) ×6
KIT BASIN OR (CUSTOM PROCEDURE TRAY) ×2 IMPLANT
KIT TURNOVER KIT B (KITS) ×2 IMPLANT
NS IRRIG 1000ML POUR BTL (IV SOLUTION) ×2 IMPLANT
PAD ARMBOARD 7.5X6 YLW CONV (MISCELLANEOUS) ×2 IMPLANT
POUCH SPECIMEN RETRIEVAL 10MM (ENDOMECHANICALS) ×2 IMPLANT
SCISSORS LAP 5X35 DISP (ENDOMECHANICALS) ×2 IMPLANT
SET IRRIG TUBING LAPAROSCOPIC (IRRIGATION / IRRIGATOR) ×2 IMPLANT
SET TUBE SMOKE EVAC HIGH FLOW (TUBING) ×2 IMPLANT
SLEEVE ENDOPATH XCEL 5M (ENDOMECHANICALS) ×4 IMPLANT
SPECIMEN JAR SMALL (MISCELLANEOUS) ×2 IMPLANT
SUT MNCRL AB 4-0 PS2 18 (SUTURE) ×2 IMPLANT
TOWEL GREEN STERILE (TOWEL DISPOSABLE) ×2 IMPLANT
TOWEL GREEN STERILE FF (TOWEL DISPOSABLE) ×2 IMPLANT
TRAY LAPAROSCOPIC MC (CUSTOM PROCEDURE TRAY) ×2 IMPLANT
TROCAR XCEL BLUNT TIP 100MML (ENDOMECHANICALS) ×2 IMPLANT
TROCAR XCEL NON-BLD 5MMX100MML (ENDOMECHANICALS) ×2 IMPLANT
WATER STERILE IRR 1000ML POUR (IV SOLUTION) ×2 IMPLANT

## 2021-03-16 NOTE — Transfer of Care (Signed)
Immediate Anesthesia Transfer of Care Note  Patient: [name]  Procedure(s) Performed: LAPAROSCOPIC CHOLECYSTECTOMY WITH INTRAOPERATIVE CHOLANGIOGRAM  Patient Location: PACU  Anesthesia Type:General  Level of Consciousness: drowsy, patient cooperative and responds to stimulation  Airway & Oxygen Therapy: Patient Spontanous Breathing  Post-op Assessment: Report given to RN and Post -op Vital signs reviewed and stable  Post vital signs: Reviewed and stable  Last Vitals:  Vitals Value Taken Time  BP 132/72 03/16/21 1200  Temp    Pulse 72 03/16/21 1201  Resp 16 03/16/21 1201  SpO2 95 % 03/16/21 1201  Vitals shown include unvalidated device data.  Last Pain:  Vitals:   03/16/21 0912  TempSrc: Oral  PainSc: 0-No pain         Complications: No notable events documented.

## 2021-03-16 NOTE — Anesthesia Procedure Notes (Signed)
Procedure Name: Intubation Date/Time: 03/16/2021 10:36 AM Performed by: Janace Litten, CRNA Pre-anesthesia Checklist: Patient identified, Emergency Drugs available, Suction available and Patient being monitored Patient Re-evaluated:Patient Re-evaluated prior to induction Oxygen Delivery Method: Circle System Utilized Preoxygenation: Pre-oxygenation with 100% oxygen Induction Type: IV induction Ventilation: Mask ventilation without difficulty Laryngoscope Size: Mac and 4 Grade View: Grade I Tube type: Oral Tube size: 7.0 mm Number of attempts: 1 Airway Equipment and Method: Stylet and Oral airway Placement Confirmation: ETT inserted through vocal cords under direct vision, positive ETCO2 and breath sounds checked- equal and bilateral Secured at: 22 cm Tube secured with: Tape Dental Injury: Teeth and Oropharynx as per pre-operative assessment

## 2021-03-16 NOTE — H&P (Signed)
PROVIDER: Landry Corporal, MD  MRN: [id number] DOB: 08/20/1975 Subjective  Chief Complaint: Cholelithiasis   History of Present Illness: [name] is a 45 y.o. female who is seen today as an office consultation at the request of Dr. Pasty Arch for evaluation of Cholelithiasis .   We are asked to see the patient in consultation by Dr. Montine Circle to evaluate her for gallstones. The patient is a 45 year old black female who has had a couple episodes of back pain in the last few months. The pain is been associated with nausea and vomiting. The pain has been on the right side. She denies any fevers or chills. She underwent a CT scan which did show stones in the gallbladder but no gallbladder wall thickening or ductal dilatation. She also had fibroids of the uterus. Her liver functions were normal.  Review of Systems: A complete review of systems was obtained from the patient. I have reviewed this information and discussed as appropriate with the patient. See HPI as well for other ROS.  ROS   Medical History: Past Medical History:  Diagnosis Date   Anemia   Anxiety   Arrhythmia   Arthritis   Hypertension   Patient Active Problem List  Diagnosis   Calculus of gallbladder without cholecystitis without obstruction   History reviewed. No pertinent surgical history.   No Known Allergies  Current Outpatient Medications on File Prior to Visit  Medication Sig Dispense Refill   escitalopram oxalate (LEXAPRO) 10 MG tablet Take 10 mg by mouth once daily   hydroCHLOROthiazide (HYDRODIURIL) 25 MG tablet Take 25 mg by mouth once daily   No current facility-administered medications on file prior to visit.   Family History  Problem Relation Age of Onset   Obesity Mother   High blood pressure (Hypertension) Mother   Obesity Sister   High blood pressure (Hypertension) Sister   Heart valve disease Brother   High blood pressure (Hypertension) Brother   Hyperlipidemia (Elevated  cholesterol) Brother   Diabetes Brother    Social History   Tobacco Use  Smoking Status Never Smoker  Smokeless Tobacco Never Used    Social History   Socioeconomic History   Marital status: Unknown  Tobacco Use   Smoking status: Never Smoker   Smokeless tobacco: Never Used  Substance and Sexual Activity   Alcohol use: Yes   Drug use: Never   Objective:   Vitals:  BP: 132/80  Pulse: 77  Temp: 36.9 C (98.5 F)  SpO2: 97%  Weight: 95.6 kg (210 lb 12.8 oz)  Height: 165.1 cm (5\' 5" )   Body mass index is 35.08 kg/m.  Physical Exam Vitals reviewed.  Constitutional:  General: She is not in acute distress. Appearance: Normal appearance.  HENT:  Head: Normocephalic and atraumatic.  Right Ear: External ear normal.  Left Ear: External ear normal.  Nose: Nose normal.  Mouth/Throat:  Mouth: Mucous membranes are moist.  Pharynx: Oropharynx is clear.  Eyes:  General: No scleral icterus. Extraocular Movements: Extraocular movements intact.  Conjunctiva/sclera: Conjunctivae normal.  Pupils: Pupils are equal, round, and reactive to light.  Cardiovascular:  Rate and Rhythm: Normal rate and regular rhythm.  Pulses: Normal pulses.  Heart sounds: Normal heart sounds.  Pulmonary:  Effort: Pulmonary effort is normal. No respiratory distress.  Breath sounds: Normal breath sounds.  Abdominal:  General: Bowel sounds are normal.  Palpations: Abdomen is soft. There is no mass.  Tenderness: There is no abdominal tenderness.  Musculoskeletal:  General: No swelling, tenderness or deformity.  Normal range of motion.  Cervical back: Normal range of motion and neck supple.  Skin: General: Skin is warm and dry.  Coloration: Skin is not jaundiced.  Neurological:  General: No focal deficit present.  Mental Status: She is alert and oriented to person, place, and time.  Psychiatric:  Mood and Affect: Mood normal.  Behavior: Behavior normal.     Labs, Imaging and Diagnostic  Testing:  Assessment and Plan:  Diagnoses and all orders for this visit:  Calculus of gallbladder without cholecystitis without obstruction - [facility] Case Posting Request; Future    The patient appears to have symptomatic gallstones. Because of the risk of further painful episodes and possible pancreatitis I feel she would benefit from having her gallbladder removed. She would also like to have this done. I have discussed with her in detail the risks and benefits of the operation to remove the gallbladder as well as some of the technical aspects including the risk of common duct injury and she understands and wishes to proceed. She will be a good candidate for laparoscopic cholecystectomy with intraoperative cholangiogram

## 2021-03-16 NOTE — Interval H&P Note (Signed)
History and Physical Interval Note:  03/16/2021 10:00 AM  [name]  has presented today for surgery, with the diagnosis of GALLSTONES.  The various methods of treatment have been discussed with the patient and family. After consideration of risks, benefits and other options for treatment, the patient has consented to  Procedure(s) with comments: LAPAROSCOPIC CHOLECYSTECTOMY WITH INTRAOPERATIVE CHOLANGIOGRAM (N/A) - 90 MINUTES as a surgical intervention.  The patient's history has been reviewed, patient examined, no change in status, stable for surgery.  I have reviewed the patient's chart and labs.  Questions were answered to the patient's satisfaction.     Autumn Messing III

## 2021-03-16 NOTE — Op Note (Signed)
03/16/2021  11:50 AM  PATIENT:  [name]  45 y.o. female  PRE-OPERATIVE DIAGNOSIS:  GALLSTONES  POST-OPERATIVE DIAGNOSIS:  GALLSTONES  PROCEDURE:  Procedure(s) with comments: LAPAROSCOPIC CHOLECYSTECTOMY WITH INTRAOPERATIVE CHOLANGIOGRAM (N/A) - 90 MINUTES  SURGEON:  Surgeon(s) and Role:    * Jovita Kussmaul, MD - Primary  PHYSICIAN ASSISTANT:   ASSISTANTS: none   ANESTHESIA:   local and general  EBL:  minimal   BLOOD ADMINISTERED:none  DRAINS: none   LOCAL MEDICATIONS USED:  MARCAINE     SPECIMEN:  Source of Specimen:  gallbladder  DISPOSITION OF SPECIMEN:  PATHOLOGY  COUNTS:  YES  TOURNIQUET:  * No tourniquets in log *  DICTATION: .Dragon Dictation    Procedure: After informed consent was obtained the patient was brought to the operating room and placed in the supine position on the operating room table. After adequate induction of general anesthesia the patient's abdomen was prepped with ChloraPrep allowed to dry and draped in usual sterile manner. An appropriate timeout was performed. The area below the umbilicus was infiltrated with quarter percent  Marcaine. A small incision was made with a 15 blade knife. The incision was carried down through the subcutaneous tissue bluntly with a hemostat and [employer] retractors. The linea alba was identified. The linea alba was incised with a 15 blade knife and each side was grasped with Coker clamps. The preperitoneal space was then probed with a hemostat until the peritoneum was opened and access was gained to the abdominal cavity. A 0 Vicryl pursestring stitch was placed in the fascia surrounding the opening. A Hassan cannula was then placed through the opening and anchored in place with the previously placed Vicryl purse string stitch. The abdomen was insufflated with carbon dioxide without difficulty. A laparoscope was inserted through the [facility] cannula in the right upper quadrant was inspected. Next the epigastric  region was infiltrated with % Marcaine. A small incision was made with a 15 blade knife. A 5 mm port was placed bluntly through this incision into the abdominal cavity under direct vision. Next 2 sites were chosen laterally on the right side of the abdomen for placement of 5 mm ports. Each of these areas was infiltrated with quarter percent Marcaine. Small stab incisions were made with a 15 blade knife. 5 mm ports were then placed bluntly through these incisions into the abdominal cavity under direct vision without difficulty. A blunt grasper was placed through the lateralmost 5 mm port and used to grasp the dome of the gallbladder and elevated anteriorly and superiorly. Another blunt grasper was placed through the other 5 mm port and used to retract the body and neck of the gallbladder. A dissector was placed through the epigastric port and using the electrocautery the peritoneal reflection at the gallbladder neck was opened. Blunt dissection was then carried out in this area until the gallbladder neck-cystic duct junction was readily identified and a good window was created. A single clip was placed on the gallbladder neck. A small  ductotomy was made just below the clip with laparoscopic scissors. A 14-gauge Angiocath was then placed through the anterior abdominal wall under direct vision. A Reddick cholangiogram catheter was then placed through the Angiocath and flushed. The catheter was then placed in the cystic duct and anchored in place with a clip. A cholangiogram was obtained that showed no filling defects good emptying into the duodenum an adequate length on the cystic duct. The anchoring clip and catheters were then removed from the patient.  3 clips were placed proximally on the cystic duct and the duct was divided between the 2 sets of clips. Posterior to this the cystic artery was identified and again dissected bluntly in a circumferential manner until a good window  was created. 2 clips were placed  proximally and one distally on the artery and the artery was divided between the 2 sets of clips. Next a laparoscopic hook cautery device was used to separate the gallbladder from the liver bed. Prior to completely detaching the gallbladder from the liver bed the liver bed was inspected and several small bleeding points were coagulated with the electrocautery until the area was completely hemostatic. The gallbladder was then detached the rest of it from the liver bed without difficulty. A laparoscopic bag was inserted through the hassan port. The laparoscope was moved to the epigastric port. The gallbladder was placed within the bag and the bag was sealed.  The bag with the gallbladder was then removed with the [facility] cannula through the infraumbilical port without difficulty. The fascial defect was then closed with the previously placed Vicryl pursestring stitch as well as with another figure-of-eight 0 Vicryl stitch. The liver bed was inspected again and found to be hemostatic. The abdomen was irrigated with copious amounts of saline until the effluent was clear. The ports were then removed under direct vision without difficulty and were found to be hemostatic. The gas was allowed to escape. The skin incisions were all closed with interrupted 4-0 Monocryl subcuticular stitches. Dermabond dressings were applied. The patient tolerated the procedure well. At the end of the case all needle sponge and instrument counts were correct. The patient was then awakened and taken to recovery in stable condition   PLAN OF CARE: Discharge to home after PACU  PATIENT DISPOSITION:  PACU - hemodynamically stable.   Delay start of Pharmacological VTE agent (>24hrs) due to surgical blood loss or risk of bleeding: not applicable

## 2021-03-17 ENCOUNTER — Encounter (HOSPITAL_COMMUNITY): Payer: Self-pay | Admitting: General Surgery

## 2021-03-17 LAB — SURGICAL PATHOLOGY

## 2021-03-17 NOTE — Anesthesia Postprocedure Evaluation (Signed)
Anesthesia Post Note  Patient: [name]  Procedure(s) Performed: LAPAROSCOPIC CHOLECYSTECTOMY WITH INTRAOPERATIVE CHOLANGIOGRAM     Patient location during evaluation: PACU Anesthesia Type: General Level of consciousness: awake and alert Pain management: pain level controlled Vital Signs Assessment: post-procedure vital signs reviewed and stable Respiratory status: spontaneous breathing, nonlabored ventilation, respiratory function stable and patient connected to nasal cannula oxygen Cardiovascular status: blood pressure returned to baseline and stable Postop Assessment: no apparent nausea or vomiting Anesthetic complications: no   No notable events documented.  Last Vitals:  Vitals:   03/16/21 1315 03/16/21 1330  BP: 129/83 126/76  Pulse: 67 85  Resp: 17 20  Temp:    SpO2: 100% 96%    Last Pain:  Vitals:   03/16/21 1330  TempSrc:   PainSc: 7                  Shakiyla Kook L Austina Constantin

## 2021-05-06 ENCOUNTER — Emergency Department (HOSPITAL_COMMUNITY): Payer: Medicaid Other

## 2021-05-06 ENCOUNTER — Emergency Department (HOSPITAL_COMMUNITY)
Admission: EM | Admit: 2021-05-06 | Discharge: 2021-05-07 | Disposition: A | Discharge status: Home / Self Care | Payer: Medicaid Other | Attending: Emergency Medicine | Admitting: Emergency Medicine

## 2021-05-06 ENCOUNTER — Other Ambulatory Visit: Payer: Self-pay

## 2021-05-06 DIAGNOSIS — Z8542 Personal history of malignant neoplasm of other parts of uterus: Secondary | ICD-10-CM | POA: Insufficient documentation

## 2021-05-06 DIAGNOSIS — R1011 Right upper quadrant pain: Secondary | ICD-10-CM | POA: Diagnosis present

## 2021-05-06 DIAGNOSIS — R1031 Right lower quadrant pain: Secondary | ICD-10-CM | POA: Insufficient documentation

## 2021-05-06 DIAGNOSIS — I10 Essential (primary) hypertension: Secondary | ICD-10-CM | POA: Insufficient documentation

## 2021-05-06 DIAGNOSIS — N3001 Acute cystitis with hematuria: Secondary | ICD-10-CM

## 2021-05-06 LAB — URINALYSIS, ROUTINE W REFLEX MICROSCOPIC
Bilirubin Urine: NEGATIVE
Glucose, UA: NEGATIVE mg/dL
Ketones, ur: NEGATIVE mg/dL
Nitrite: NEGATIVE
Protein, ur: NEGATIVE mg/dL
Specific Gravity, Urine: <1.005 — ABNORMAL LOW (ref 1.005–1.030)
pH: 6 (ref 5.0–8.0)

## 2021-05-06 LAB — CBC WITH DIFFERENTIAL/PLATELET
Abs Immature Granulocytes: 0.02 10*3/uL (ref 0.00–0.07)
Basophils Absolute: 0 10*3/uL (ref 0.0–0.1)
Basophils Relative: 0 %
Eosinophils Absolute: 0.1 10*3/uL (ref 0.0–0.5)
Eosinophils Relative: 1 %
HCT: 36.9 % (ref 36.0–46.0)
Hemoglobin: 12.3 g/dL (ref 12.0–15.0)
Immature Granulocytes: 0 %
Lymphocytes Relative: 18 %
Lymphs Abs: 1.4 10*3/uL (ref 0.7–4.0)
MCH: 30.8 pg (ref 26.0–34.0)
MCHC: 33.3 g/dL (ref 30.0–36.0)
MCV: 92.5 fL (ref 80.0–100.0)
Monocytes Absolute: 1.1 10*3/uL — ABNORMAL HIGH (ref 0.1–1.0)
Monocytes Relative: 14 %
Neutro Abs: 5.4 10*3/uL (ref 1.7–7.7)
Neutrophils Relative %: 67 %
Platelets: 325 10*3/uL (ref 150–400)
RBC: 3.99 MIL/uL (ref 3.87–5.11)
RDW: 13 % (ref 11.5–15.5)
WBC: 8 10*3/uL (ref 4.0–10.5)
nRBC: 0 % (ref 0.0–0.2)

## 2021-05-06 LAB — COMPREHENSIVE METABOLIC PANEL
ALT: 32 U/L (ref 0–44)
AST: 28 U/L (ref 15–41)
Albumin: 3.4 g/dL — ABNORMAL LOW (ref 3.5–5.0)
Alkaline Phosphatase: 62 U/L (ref 38–126)
Anion gap: 8 (ref 5–15)
BUN: <5 mg/dL — ABNORMAL LOW (ref 6–20)
CO2: 24 mmol/L (ref 22–32)
Calcium: 9.2 mg/dL (ref 8.9–10.3)
Chloride: 103 mmol/L (ref 98–111)
Creatinine, Ser: 0.89 mg/dL (ref 0.44–1.00)
GFR, Estimated: >60 mL/min (ref >60)
Glucose, Bld: 97 mg/dL (ref 70–99)
Potassium: 3.2 mmol/L — ABNORMAL LOW (ref 3.5–5.1)
Sodium: 135 mmol/L (ref 135–145)
Total Bilirubin: 0.7 mg/dL (ref 0.3–1.2)
Total Protein: 8.2 g/dL — ABNORMAL HIGH (ref 6.5–8.1)

## 2021-05-06 LAB — URINALYSIS, MICROSCOPIC (REFLEX)

## 2021-05-06 LAB — LIPASE, BLOOD: Lipase: 37 U/L (ref 11–51)

## 2021-05-06 LAB — PREGNANCY, URINE: Preg Test, Ur: NEGATIVE

## 2021-05-06 MED ORDER — MORPHINE SULFATE (PF) 4 MG/ML IV SOLN: 4.0000 mg | INTRAVENOUS | Status: DC | PRN

## 2021-05-06 MED ORDER — MORPHINE SULFATE (PF) 4 MG/ML IV SOLN
4.0000 mg | Freq: Once | INTRAVENOUS | Status: AC
  Administered 2021-05-06: 22:00:00 4 mg via INTRAVENOUS
  Filled 2021-05-06: qty 1

## 2021-05-06 NOTE — ED Provider Notes (Signed)
[facility] DEPARTMENT Provider Note   CSN: [id number] Arrival date & time: 05/06/21  1910     History Chief Complaint  Patient presents with   Abdominal Pain    [name] is a 45 y.o. female with recent cholecystectomy on 03/15/2021 presents to the ED for evaluation of RLQ pain radiating into the RUQ and right back that started 4 days ago. Pt describes pain as pressure/soreness with no alleviating factors. Pain is worse with palpation and movement. She also endorses some nausea and chills. She denies fevers, vomiting, diarrhea, constipation, vaginal pain/discharge/bleeding, dysuria, hematuria. LNMP finished 4 days ago.   Abdominal Pain Associated symptoms: no dysuria, no fever, no hematuria, no shortness of breath, no vaginal bleeding and no vomiting       Past Medical History:  Diagnosis Date   Anemia    Anxiety    Arthritis    right foot   Bronchitis    Depressed    Family history of adverse reaction to anesthesia    twin sister - N/V   Heart murmur    was informed of this when she was a teen, has not seen a cardiologist or had an ECHO   Hypertension    Palpitations    Strep pharyngitis     Patient Active Problem List   Diagnosis Date Noted   Osteoarthritis of ankle and foot 05/24/2015   Coalition, tarsal 05/24/2015   Fibroid, uterine 01/27/2015   Unspecified disorder of menstruation and other abnormal bleeding from female genital tract 01/22/2014   Dysmenorrhea 01/22/2014   Cervical high risk human papillomavirus (HPV) DNA test positive 03/12/2013   Vaginitis and vulvovaginitis, unspecified 09/03/2012    Past Surgical History:  Procedure Laterality Date   CHOLECYSTECTOMY N/A 03/16/2021   Procedure: LAPAROSCOPIC CHOLECYSTECTOMY WITH INTRAOPERATIVE CHOLANGIOGRAM;  Surgeon: Jovita Kussmaul, MD;  Location: [facility] OR;  Service: General;  Laterality: N/A;  24 MINUTES   NO PAST SURGERIES     oral surgery       OB History     Gravida   2   Para  0   Term      Preterm      AB  2   Living  0      SAB  1   IAB  1   Ectopic      Multiple      Live Births              Family History  Problem Relation Age of Onset   Heart disease Mother    Bronchitis Mother    Asthma Other    Hypertension Other    Diabetes Brother     Social History   Tobacco Use   Smoking status: Never   Smokeless tobacco: Never  Vaping Use   Vaping Use: Never used  Substance Use Topics   Alcohol use: Never   Drug use: Never    Home Medications Prior to Admission medications   Medication Sig Start Date End Date Taking? Authorizing Provider  escitalopram (LEXAPRO) 10 MG tablet Take 10 mg by mouth daily.    [provider]  hydrochlorothiazide (HYDRODIURIL) 25 MG tablet Take 25 mg by mouth daily.    [provider]  HYDROcodone-acetaminophen (NORCO/VICODIN) 5-325 MG tablet Take 1 tablet by mouth every 6 (six) hours as needed for moderate pain or severe pain. 03/16/21   Autumn Messing III, MD  ibuprofen (ADVIL) 800 MG tablet Take 1 tablet (800 mg total)  by mouth every 8 (eight) hours as needed. 02/09/20   Shelly Bombard, MD    Allergies    Patient has no known allergies.  Review of Systems   Review of Systems  Constitutional:  Negative for fever.  HENT: Negative.    Eyes: Negative.   Respiratory:  Negative for shortness of breath.   Cardiovascular: Negative.   Gastrointestinal:  Positive for abdominal pain. Negative for vomiting.  Endocrine: Negative.   Genitourinary:  Positive for pelvic pain. Negative for dysuria, hematuria, vaginal bleeding and vaginal pain.  Musculoskeletal: Negative.   Skin:  Negative for rash.  Neurological:  Negative for headaches.  All other systems reviewed and are negative.  Physical Exam Updated Vital Signs BP (!) 153/99 (BP Location: Right Arm)    Pulse 71    Temp 98.3 F (36.8 C) (Oral)    Resp 19    Ht 5' 5.5" (1.664 m)    Wt 90.7 kg    SpO2 100%    BMI 32.78  kg/m   Physical Exam Vitals and nursing note reviewed.  Constitutional:      General: She is not in acute distress.    Appearance: She is not ill-appearing.  HENT:     Head: Atraumatic.  Eyes:     Conjunctiva/sclera: Conjunctivae normal.  Cardiovascular:     Rate and Rhythm: Normal rate and regular rhythm.     Pulses: Normal pulses.     Heart sounds: No murmur heard. Pulmonary:     Effort: Pulmonary effort is normal. No respiratory distress.     Breath sounds: Normal breath sounds.  Abdominal:     General: Abdomen is flat. There is no distension.     Palpations: Abdomen is soft.     Tenderness: There is no abdominal tenderness.       Comments: TTP in midline and right sided pelvic region with radiation up towards the flank. Otherwise, abdomen soft, nondistended, without peritoneal signs. Negative CVA tenderness   Musculoskeletal:        General: Normal range of motion.     Cervical back: Normal range of motion.  Skin:    General: Skin is warm and dry.     Capillary Refill: Capillary refill takes less than 2 seconds.  Neurological:     General: No focal deficit present.     Mental Status: She is alert.  Psychiatric:        Mood and Affect: Mood normal.    ED Results / Procedures / Treatments   Labs (all labs ordered are listed, but only abnormal results are displayed) Labs Reviewed  CBC WITH DIFFERENTIAL/PLATELET - Abnormal; Notable for the following components:      Result Value   Monocytes Absolute 1.1 (*)    All other components within normal limits  URINALYSIS, ROUTINE W REFLEX MICROSCOPIC - Abnormal; Notable for the following components:   Specific Gravity, Urine <1.005 (*)    Hgb urine dipstick MODERATE (*)    Leukocytes,Ua LARGE (*)    All other components within normal limits  URINALYSIS, MICROSCOPIC (REFLEX) - Abnormal; Notable for the following components:   Bacteria, UA RARE (*)    All other components within normal limits  COMPREHENSIVE METABOLIC  PANEL  LIPASE, BLOOD    EKG None  Radiology US Abdomen Limited RUQ (LIVER/GB)  Result Date: 05/06/2021 CLINICAL DATA:  Right upper quadrant abdominal pain. EXAM: ULTRASOUND ABDOMEN LIMITED RIGHT UPPER QUADRANT COMPARISON:  CT abdomen pelvis dated 11/22/2020. FINDINGS: Gallbladder: Cholecystectomy. Common bile  duct: Diameter: 6 mm Liver: No focal lesion identified. Within normal limits in parenchymal echogenicity. Portal vein is patent on color Doppler imaging with normal direction of blood flow towards the liver. Other: None. IMPRESSION: Cholecystectomy, otherwise unremarkable right upper quadrant ultrasound. Electronically Signed   By: Anner Crete M.D.   On: 05/06/2021 20:16    Procedures Procedures   Medications Ordered in ED Medications  morphine 4 MG/ML injection 4 mg (has no administration in time range)    ED Course  I have reviewed the triage vital signs and the nursing notes.  Pertinent labs & imaging results that were available during my care of the patient were reviewed by me and considered in my medical decision making (see chart for details).    MDM Rules/Calculators/A&P                         Duplicate chart, please see other note for full MDM Final Clinical Impression(s) / ED Diagnoses Final diagnoses:  RUQ abdominal pain    Rx / DC Orders ED Discharge Orders     None        Tonye Pearson, PA-C 05/10/21 1525    Drenda Freeze, MD 05/12/21 [phone]

## 2021-05-06 NOTE — ED Provider Notes (Signed)
Emergency Medicine Provider Triage Evaluation Note  [name] , a 45 y.o. female  was evaluated in triage.  Pt complains of right upper quadrant abdominal pain and back pain.  Patient states that she had cholecystectomy on 03/16/2021.  She states that she has not had any issues until the past 4 days when she states that she feels like she is having abdominal fullness.  She states for the past 2 days she has had nausea after eating.  She denies fevers.  Denies vomiting, diarrhea.  She denies any vaginal discharge or urinary symptoms.  Review of Systems  Positive: See above Negative:   Physical Exam  BP (!) 153/99 (BP Location: Right Arm)    Pulse 71    Temp 98.3 F (36.8 C) (Oral)    Resp 19    Ht 5' 5.5" (1.664 m)    Wt 90.7 kg    SpO2 100%    BMI 32.78 kg/m  Gen:   Awake, no distress   Resp:  Normal effort  MSK:   Moves extremities without difficulty  Other:  Abdomen is rounded, soft.  Tenderness to the epigastric and right upper quadrant region.  Positive Murphy's  Medical Decision Making  Medically screening exam initiated at 7:30 PM.  Appropriate orders placed.  Jackalyn Haith was informed that the remainder of the evaluation will be completed by another provider, this initial triage assessment does not replace that evaluation, and the importance of remaining in the ED until their evaluation is complete.     Mickie Hillier, PA-C 05/06/21 1937    Wyvonnia Dusky, MD 05/06/21 [phone]

## 2021-05-06 NOTE — ED Triage Notes (Signed)
Pt reported to ED with c/o rt sided abdominal pain that radiates into back. Pt states he pain feels  similar to pain prior to gallbladder removal. States she also sees "tissue" when she wipe after urinating. Also endorses feelings of nausea after eating.

## 2021-05-07 ENCOUNTER — Emergency Department (HOSPITAL_COMMUNITY): Payer: Medicaid Other

## 2021-05-07 MED ORDER — IOHEXOL 300 MG/ML  SOLN
100.0000 mL | Freq: Once | INTRAMUSCULAR | Status: AC | PRN
  Administered 2021-05-07: 01:00:00 100 mL via INTRAVENOUS

## 2021-05-07 MED ORDER — CEPHALEXIN 500 MG PO CAPS: 500.0000 mg | ORAL_CAPSULE | Freq: Two times a day (BID) | ORAL | 0 refills | Status: AC

## 2021-05-07 NOTE — Discharge Instructions (Signed)
You were diagnosed with a UTI which is the cause for your abdominal pain. Sometimes you can have an infection without typical symptoms such as burning when you pee or increased urge to urinate. I have attached some information here for you regarding UTIs. Fortunately, your CT was negative for infection that has spread to your kidneys, so you are good to go home with a one week course of Keflex to use as directed.   Please return if you develop fevers and worsening pain. Happy holidays!

## 2021-05-10 NOTE — ED Provider Notes (Signed)
[facility] EM[facility] DEPARTMENT Provider Note   CSN: [id number] Arrival date & time: 05/06/21  1910     History Chief Complaint  Patient presents with   Abdominal Pain    [name] is a 45 y.o. female who presents for evaluation of right upper quadrant pain that radiates into the back that started about 4 days ago and has been worsening since onset.  She endorses abdominal fullness with postprandial nausea without vomiting.  She has been taking over-the-counter pain medication such as Tylenol and Motrin without any relief.  She denies fevers, chills, chest pain, shortness of breath, constipation, diarrhea, dysuria, hematuria.   Abdominal Pain Associated symptoms: nausea   Associated symptoms: no constipation, no diarrhea, no fever, no shortness of breath and no vomiting       Past Medical History:  Diagnosis Date   Anemia    Anxiety    Arthritis    right foot   Bronchitis    Depressed    Family history of adverse reaction to anesthesia    twin sister - N/V   Heart murmur    was informed of this when she was a teen, has not seen a cardiologist or had an ECHO   Hypertension    Palpitations    Strep pharyngitis     Patient Active Problem List   Diagnosis Date Noted   Osteoarthritis of ankle and foot 05/24/2015   Coalition, tarsal 05/24/2015   Fibroid, uterine 01/27/2015   Unspecified disorder of menstruation and other abnormal bleeding from female genital tract 01/22/2014   Dysmenorrhea 01/22/2014   Cervical high risk human papillomavirus (HPV) DNA test positive 03/12/2013   Vaginitis and vulvovaginitis, unspecified 09/03/2012    Past Surgical History:  Procedure Laterality Date   CHOLECYSTECTOMY N/A 03/16/2021   Procedure: LAPAROSCOPIC CHOLECYSTECTOMY WITH INTRAOPERATIVE CHOLANGIOGRAM;  Surgeon: Jovita Kussmaul, MD;  Location: [facility] OR;  Service: General;  Laterality: N/A;  83 MINUTES   NO PAST SURGERIES     oral surgery       OB History      Gravida  2   Para  0   Term      Preterm      AB  2   Living  0      SAB  1   IAB  1   Ectopic      Multiple      Live Births              Family History  Problem Relation Age of Onset   Heart disease Mother    Bronchitis Mother    Asthma Other    Hypertension Other    Diabetes Brother     Social History   Tobacco Use   Smoking status: Never   Smokeless tobacco: Never  Vaping Use   Vaping Use: Never used  Substance Use Topics   Alcohol use: Never   Drug use: Never    Home Medications Prior to Admission medications   Medication Sig Start Date End Date Taking? Authorizing Provider  cephALEXin (KEFLEX) 500 MG capsule Take 1 capsule (500 mg total) by mouth 2 (two) times daily for 7 days. 05/07/21 05/14/21 Yes Kathe Becton R, PA-C  escitalopram (LEXAPRO) 10 MG tablet Take 10 mg by mouth daily.   Yes [provider]  FEROSUL 325 (65 Fe) MG tablet Take 325 mg by mouth daily. 03/09/21  Yes [provider]  hydrochlorothiazide (HYDRODIURIL) 25 MG tablet Take 25 mg  by mouth daily.   Yes [provider]  ibuprofen (ADVIL) 200 MG tablet Take 400 mg by mouth every 6 (six) hours as needed for headache or moderate pain.   Yes [provider]  HYDROcodone-acetaminophen (NORCO/VICODIN) 5-325 MG tablet Take 1 tablet by mouth every 6 (six) hours as needed for moderate pain or severe pain. Patient not taking: Reported on 05/06/2021 03/16/21   Autumn Messing III, MD  ibuprofen (ADVIL) 800 MG tablet Take 1 tablet (800 mg total) by mouth every 8 (eight) hours as needed. Patient not taking: Reported on 05/06/2021 02/09/20   Shelly Bombard, MD    Allergies    Patient has no known allergies.  Review of Systems   Review of Systems  Constitutional:  Negative for fever.  HENT: Negative.    Eyes: Negative.   Respiratory:  Negative for shortness of breath.   Cardiovascular: Negative.   Gastrointestinal:  Positive for abdominal pain and  nausea. Negative for constipation, diarrhea and vomiting.  Endocrine: Negative.   Genitourinary: Negative.   Musculoskeletal:  Positive for back pain.  Skin:  Negative for rash.  Neurological:  Negative for headaches.  All other systems reviewed and are negative.  Physical Exam Updated Vital Signs BP (!) 159/90    Pulse 66    Temp 97.8 F (36.6 C) (Oral)    Resp 17    Ht 5' 5.5" (1.664 m)    Wt 90.7 kg    SpO2 100%    BMI 32.78 kg/m   Physical Exam Vitals and nursing note reviewed.  Constitutional:      General: She is not in acute distress.    Appearance: She is not ill-appearing.  HENT:     Head: Atraumatic.  Eyes:     Conjunctiva/sclera: Conjunctivae normal.  Cardiovascular:     Rate and Rhythm: Normal rate and regular rhythm.     Pulses: Normal pulses.     Heart sounds: No murmur heard. Pulmonary:     Effort: Pulmonary effort is normal. No respiratory distress.     Breath sounds: Normal breath sounds.  Abdominal:     General: Abdomen is flat. There is no distension.     Palpations: Abdomen is soft.     Tenderness: There is no abdominal tenderness.     Comments: Abdomen is rounded but soft.  She is tender to palpation in the right upper quadrant and epigastric region.  Negative McBurney.  Positive Murphy.  Negative CVA tenderness.  Musculoskeletal:        General: Normal range of motion.     Cervical back: Normal range of motion.  Skin:    General: Skin is warm and dry.     Capillary Refill: Capillary refill takes less than 2 seconds.  Neurological:     General: No focal deficit present.     Mental Status: She is alert.  Psychiatric:        Mood and Affect: Mood normal.    ED Results / Procedures / Treatments   Labs (all labs ordered are listed, but only abnormal results are displayed) Labs Reviewed  COMPREHENSIVE METABOLIC PANEL - Abnormal; Notable for the following components:      Result Value   Potassium 3.2 (*)    BUN <5 (*)    Total Protein 8.2 (*)     Albumin 3.4 (*)    All other components within normal limits  CBC WITH DIFFERENTIAL/PLATELET - Abnormal; Notable for the following components:   Monocytes Absolute 1.1 (*)  All other components within normal limits  URINALYSIS, ROUTINE W REFLEX MICROSCOPIC - Abnormal; Notable for the following components:   Specific Gravity, Urine <1.005 (*)    Hgb urine dipstick MODERATE (*)    Leukocytes,Ua LARGE (*)    All other components within normal limits  URINALYSIS, MICROSCOPIC (REFLEX) - Abnormal; Notable for the following components:   Bacteria, UA RARE (*)    All other components within normal limits  LIPASE, BLOOD  PREGNANCY, URINE    EKG None  Radiology No results found.  Procedures Procedures   Medications Ordered in ED Medications  morphine 4 MG/ML injection 4 mg (4 mg Intravenous Given 05/06/21 2220)  iohexol (OMNIPAQUE) 300 MG/ML solution 100 mL (100 mLs Intravenous Contrast Given 05/07/21 0034)    ED Course  I have reviewed the triage vital signs and the nursing notes.  Pertinent labs & imaging results that were available during my care of the patient were reviewed by me and considered in my medical decision making (see chart for details).    MDM Rules/Calculators/A&P                         Is a 45 year old female who presents for evaluation of right upper quadrant abdominal pain.  This carries with it numerous differentials with high complexity. The differential diagnosis for RUQ is, but not limited to:  Cholelithiasis, cholecystitis, hepatitis, eg, viral, alcoholic, toxic, cholangitis or choledocholithiasis, peptic ulcer disease (duodenal), pancreatitis, functional or nonulcer dyspepsia, liver abscess, liver, pancreatic, or biliary tract cancer, ischemic hepatopathy (shock liver), hepatic vein obstruction (Budd-Chiari syndrome), right lower lobe pneumonia, pyelonephritis, urinary calculi, liver cell adenoma, herpes zoster, trauma or musculoskeletal pain, herniated  disk, abdominal abscess , intestinal ischemia, physical or sexual abuse, ectopic pregnancy, IUP, Mittelschmerz, appendicitis, cholecystitis, ovarian cyst/torsion, threatened/ievitable abortion, PID, endometriosis, molar pregnancy, UTI/renal colic, heterotopic pregnancy, IBD, corpus luteum cyst   Labs ordered: CMP unremarkable.  CBC normal.  Pregnancy test was negative.  Lipase was also normal.  Urinalysis is suggestive of infection with moderate amounts of blood, several white blood cells and bacteria.  All labs were independently reviewed and interpreted by myself.  Imaging ordered: Given patient's positive Murphy sign, right upper quadrant ultrasound was ordered in triage.  Results of this were negative for everything other than a known cholecystectomy.  CT abdomen pelvis with contrast was also ordered as there is some suspicion about possible pyelonephritis given infection and right upper quadrant pain that radiates into the back.  CT was negative for pelvic or abdominal infection.  Kidneys normal.  There was a large myomatous uterus which may or may not be causing her symptoms.  I agree with radiology interpretation.  Patient was given pain medication, morphine 4 mg IV for pain management.  On reassessment, her pain was improved.  Disposition: Patient's physical exam, vitals and work-up are all reassuring.  Her symptoms are likely caused by an atypical UTI without standard symptoms.  We will send her home with course of Keflex in order to treat this.  We also discussed the results indicating her myomatous uterus that will require OB/GYN follow-up.  This could also be causing her symptoms, but will need to be managed outpatient with specialist.  Patient is understanding and agrees to plan.  All questions asked and answered.  Patient discharged home in good condition.   Final Clinical Impression(s) / ED Diagnoses Final diagnoses:  RUQ abdominal pain  Acute cystitis with hematuria    Rx /  DC  Orders ED Discharge Orders          Ordered    cephALEXin (KEFLEX) 500 MG capsule  2 times daily        05/07/21 0027             Tonye Pearson, PA-C 05/10/21 1511    Drenda Freeze, MD 05/12/21 1201

## 2021-10-04 NOTE — Progress Notes (Unsigned)
ID:  [name], DOB [birth year], MRN [id number]  PCP:  Benito Mccreedy, MD  Cardiologist:  Rex Kras, DO, [facility] (established care 10/05/2021) Former Cardiology Providers: ***  REASON FOR CONSULT: Palpitations  REQUESTING PHYSICIAN:  Benito Mccreedy, MD [address] [place],  [place] [address]  No chief complaint on file.   HPI  [name] is a 46 y.o. *** female who presents to the clinic for evaluation of palpitations at the request of Osei-Bonsu, Iona Beard, MD. Her past medical history and cardiovascular risk factors include: ***  ***  History of  Denies prior history of coronary artery disease, myocardial infarction, congestive heart failure, deep venous thrombosis, pulmonary embolism, stroke, transient ischemic attack.  FUNCTIONAL STATUS: ***   ALLERGIES: No Known Allergies  MEDICATION LIST PRIOR TO VISIT: No outpatient medications have been marked as taking for the 10/05/21 encounter (Appointment) with Rex Kras, DO.     PAST MEDICAL HISTORY: Past Medical History:  Diagnosis Date   Anemia    Anxiety    Arthritis    right foot   Bronchitis    Depressed    Family history of adverse reaction to anesthesia    twin sister - N/V   Heart murmur    was informed of this when she was a teen, has not seen a cardiologist or had an ECHO   Hypertension    Palpitations    Strep pharyngitis     PAST SURGICAL HISTORY: Past Surgical History:  Procedure Laterality Date   CHOLECYSTECTOMY N/A 03/16/2021   Procedure: LAPAROSCOPIC CHOLECYSTECTOMY WITH INTRAOPERATIVE CHOLANGIOGRAM;  Surgeon: Jovita Kussmaul, MD;  Location: [facility] OR;  Service: General;  Laterality: N/A;  68 MINUTES   NO PAST SURGERIES     oral surgery      FAMILY HISTORY: The patient family history includes Asthma in an other family member; Bronchitis in her mother; Diabetes in her brother; Heart disease in her mother; Hypertension in an other family member.  SOCIAL HISTORY:  The  patient  reports that she has never smoked. She has never used smokeless tobacco. She reports that she does not drink alcohol and does not use drugs.  REVIEW OF SYSTEMS: ROS  PHYSICAL EXAM:    05/07/2021    1:12 AM 05/06/2021    7:29 PM 05/06/2021    7:21 PM  Vitals with BMI  Height  5' 5.5"   Weight  200 lbs   BMI  87.86   Systolic 767  209  Diastolic 90  99  Pulse 66  71    CONSTITUTIONAL: Well-developed and well-nourished. No acute distress.  SKIN: Skin is warm and dry. No rash noted. No cyanosis. No pallor. No jaundice HEAD: Normocephalic and atraumatic.  EYES: No scleral icterus MOUTH/THROAT: Moist oral membranes.  NECK: No JVD present. No thyromegaly noted. No carotid bruits  LYMPHATIC: No visible cervical adenopathy.  CHEST Normal respiratory effort. No intercostal retractions  LUNGS: ***Clear to auscultation bilaterally.  No stridor. No wheezes. No rales.  CARDIOVASCULAR: ***Regular rate and rhythm, positive S1-S2, no murmurs rubs or gallops appreciated. ABDOMINAL: *** No apparent ascites.  EXTREMITIES: No peripheral edema, warm to touch, ***DP and PT pulses HEMATOLOGIC: No significant bruising NEUROLOGIC: Oriented to person, place, and time. Nonfocal. Normal muscle tone.  PSYCHIATRIC: Normal mood and affect. Normal behavior. Cooperative  CARDIAC DATABASE: EKG: ***  Echocardiogram: No results found for this or any previous visit from the past 1095 days.    Stress Testing: No results found for this  or any previous visit from the past 1095 days.   Heart Catheterization: None  LABORATORY DATA:    Latest Ref Rng & Units 05/06/2021    7:43 PM 03/16/2021    9:35 AM 11/21/2020    4:00 PM  CBC  WBC 4.0 - 10.5 K/uL 8.0   9.7   8.6    Hemoglobin 12.0 - 15.0 g/dL 12.3   13.0   12.9    Hematocrit 36.0 - 46.0 % 36.9   39.1   39.5    Platelets 150 - 400 K/uL 325   318   280         Latest Ref Rng & Units 05/06/2021    7:43 PM 03/16/2021    9:35 AM 11/21/2020     4:00 PM  CMP  Glucose 70 - 99 mg/dL 97   99   91    BUN 6 - 20 mg/dL <5   6   <5    Creatinine 0.44 - 1.00 mg/dL 0.89   0.91   0.87    Sodium 135 - 145 mmol/L 135   135   134    Potassium 3.5 - 5.1 mmol/L 3.2   3.0   3.4    Chloride 98 - 111 mmol/L 103   99   103    CO2 22 - 32 mmol/L '24   27   25    ' Calcium 8.9 - 10.3 mg/dL 9.2   8.9   8.9    Total Protein 6.5 - 8.1 g/dL 8.2    8.2    Total Bilirubin 0.3 - 1.2 mg/dL 0.7    0.6    Alkaline Phos 38 - 126 U/L 62    64    AST 15 - 41 U/L 28    22    ALT 0 - 44 U/L 32    26      Lipid Panel     Component Value Date/Time   CHOL 135 01/27/2015 1739   TRIG 60 01/27/2015 1739   HDL 50 01/27/2015 1739    No components found for: NTPROBNP No results for input(s): PROBNP in the last 8760 hours. No results for input(s): TSH in the last 8760 hours.  BMP Recent Labs    11/21/20 1600 03/16/21 0935 05/06/21 1943  NA 134* 135 135  K 3.4* 3.0* 3.2*  CL 103 99 103  CO2 '25 27 24  ' GLUCOSE 91 99 97  BUN <5* 6 <5*  CREATININE 0.87 0.91 0.89  CALCIUM 8.9 8.9 9.2  GFRNONAA >60 >60 >60    HEMOGLOBIN A1C Lab Results  Component Value Date   HGBA1C 5.3 08/26/2019   MPG 111 01/27/2015   External Labs:  Date Collected: 09/04/2021 , information obtained by *** Potassium: 3.9 Creatinine 0.73 mg/dL. eGFR: 96 mL/min per 1.73 m Hemoglobin: 14.1 g/dL and hematocrit: 42.1 % Lipid profile: Total cholesterol *** , triglycerides *** , HDL *** , LDL *** AST: 15 , ALT: 14 , alkaline phosphatase: 78  Hemoglobin A1c: *** TSH: 2.19    IMPRESSION:  No diagnosis found.   RECOMMENDATIONS: [name] is a 46 y.o. *** female whose past medical history and cardiac risk factors include: ***   FINAL MEDICATION LIST END OF ENCOUNTER: No orders of the defined types were placed in this encounter.   There are no discontinued medications.   Current Outpatient Medications:    escitalopram (LEXAPRO) 10 MG tablet, Take 10 mg by mouth daily.,  Disp: , Rfl:  FEROSUL 325 (65 Fe) MG tablet, Take 325 mg by mouth daily., Disp: , Rfl:    hydrochlorothiazide (HYDRODIURIL) 25 MG tablet, Take 25 mg by mouth daily., Disp: , Rfl:    HYDROcodone-acetaminophen (NORCO/VICODIN) 5-325 MG tablet, Take 1 tablet by mouth every 6 (six) hours as needed for moderate pain or severe pain. (Patient not taking: Reported on 05/06/2021), Disp: 15 tablet, Rfl: 0   ibuprofen (ADVIL) 200 MG tablet, Take 400 mg by mouth every 6 (six) hours as needed for headache or moderate pain., Disp: , Rfl:    ibuprofen (ADVIL) 800 MG tablet, Take 1 tablet (800 mg total) by mouth every 8 (eight) hours as needed. (Patient not taking: Reported on 05/06/2021), Disp: 30 tablet, Rfl: 5  No orders of the defined types were placed in this encounter.   There are no Patient Instructions on file for this visit.   --Continue cardiac medications as reconciled in final medication list. --No follow-ups on file. or sooner if needed. --Continue follow-up with your primary care physician regarding the management of your other chronic comorbid conditions.  Patient's questions and concerns were addressed to her satisfaction. She voices understanding of the instructions provided during this encounter.   This note was created using a voice recognition software as a result there may be grammatical errors inadvertently enclosed that do not reflect the nature of this encounter. Every attempt is made to correct such errors.  Rex Kras, [place], [facility]  Pager: [phone] Office: [phone]

## 2021-10-05 ENCOUNTER — Ambulatory Visit: Payer: Medicaid Other | Admitting: Cardiology

## 2021-10-05 ENCOUNTER — Encounter: Payer: Self-pay | Admitting: Cardiology

## 2021-10-05 VITALS — BP 140/78 | HR 61 | Temp 97.9°F | Resp 16 | Ht 65.0 in | Wt 217.8 lb

## 2021-10-05 DIAGNOSIS — R072 Precordial pain: Secondary | ICD-10-CM

## 2021-10-05 DIAGNOSIS — I10 Essential (primary) hypertension: Secondary | ICD-10-CM

## 2021-10-05 DIAGNOSIS — R002 Palpitations: Secondary | ICD-10-CM

## 2021-10-06 ENCOUNTER — Ambulatory Visit: Payer: Medicaid Other

## 2021-10-06 DIAGNOSIS — R072 Precordial pain: Secondary | ICD-10-CM

## 2021-10-16 ENCOUNTER — Ambulatory Visit: Payer: Medicaid Other

## 2021-10-16 ENCOUNTER — Inpatient Hospital Stay: Payer: Medicaid Other

## 2021-10-16 DIAGNOSIS — R002 Palpitations: Secondary | ICD-10-CM

## 2021-10-16 DIAGNOSIS — R072 Precordial pain: Secondary | ICD-10-CM

## 2021-10-20 ENCOUNTER — Inpatient Hospital Stay: Payer: Medicaid Other

## 2021-10-20 ENCOUNTER — Ambulatory Visit: Payer: Medicaid Other

## 2021-10-22 NOTE — Progress Notes (Signed)
Spoke to the patient on 10/20/2021 regarding the abnormal exercise stress test.  Recommended CCTA to further evaluation.  However, she has been asymptomatic since the last office visit and would like to hold off on additional testing for now.  She verbalizes understanding to seek medical attention in the intermin if she has new or worsening chest pain or shortness of breath by going to the closest ER via EMS.  Will await the results of the echo and monitor.   Cindy Korenek [place], DO, [facility]

## 2021-10-23 ENCOUNTER — Ambulatory Visit: Payer: Medicaid Other | Admitting: Nurse Practitioner

## 2021-10-31 ENCOUNTER — Emergency Department (HOSPITAL_COMMUNITY): Payer: Medicaid Other

## 2021-10-31 ENCOUNTER — Encounter (HOSPITAL_COMMUNITY): Payer: Self-pay | Admitting: Emergency Medicine

## 2021-10-31 ENCOUNTER — Emergency Department (HOSPITAL_COMMUNITY)
Admission: EM | Admit: 2021-10-31 | Discharge: 2021-10-31 | Disposition: A | Discharge status: Home / Self Care | Payer: Medicaid Other | Attending: Emergency Medicine | Admitting: Emergency Medicine

## 2021-10-31 ENCOUNTER — Other Ambulatory Visit: Payer: Self-pay

## 2021-10-31 DIAGNOSIS — Z79899 Other long term (current) drug therapy: Secondary | ICD-10-CM | POA: Insufficient documentation

## 2021-10-31 DIAGNOSIS — R519 Headache, unspecified: Secondary | ICD-10-CM | POA: Insufficient documentation

## 2021-10-31 NOTE — ED Triage Notes (Signed)
Pt complains of sharp shooting pains that come from the back of her head into her temples. This started a few weeks ago. Denies any falls/injuries. No history of migraines. Denies any other neuro symptoms

## 2021-10-31 NOTE — ED Provider Notes (Signed)
[facility] EMERGENCY DEPARTMENT Provider Note   CSN: [id number] Arrival date & time: 10/31/21  1133     History  Chief Complaint  Patient presents with   Headache    Batul Diego is a 46 y.o. female.  Presents emerged department due to concern for headache.  Patient has been having headaches for the past month.  She has not found any particular pattern to the headaches, seem to come and go at random.  Occurs nearly every day.  Earlier today it was up to 5 out of 10 in severity, frontal, nonradiating, at times feeling pulsatile.  Headache is subsided and now her headache is 0 out of 10.  No associated nausea or vomiting.  No vision changes.  She has not seen her primary doctor about this yet.  She denies numbness, weakness, speech or vision change.  No neck pain or stiffness.  HPI     Home Medications Prior to Admission medications   Medication Sig Start Date End Date Taking? Authorizing Provider  escitalopram (LEXAPRO) 10 MG tablet Take 10 mg by mouth daily.    [provider]  FEROSUL 325 (65 Fe) MG tablet Take 325 mg by mouth daily. 03/09/21   [provider]  hydrochlorothiazide (HYDRODIURIL) 25 MG tablet Take 25 mg by mouth daily.    [provider]  hydrOXYzine (VISTARIL) 25 MG capsule Take 25 mg by mouth at bedtime. 07/13/21   [provider]      Allergies    Patient has no known allergies.    Review of Systems   Review of Systems  Constitutional:  Negative for chills and fever.  HENT:  Negative for ear pain and sore throat.   Eyes:  Negative for pain and visual disturbance.  Respiratory:  Negative for cough and shortness of breath.   Cardiovascular:  Negative for chest pain and palpitations.  Gastrointestinal:  Negative for abdominal pain and vomiting.  Genitourinary:  Negative for dysuria and hematuria.  Musculoskeletal:  Negative for arthralgias and back pain.  Skin:  Negative for color change and rash.   Neurological:  Positive for headaches. Negative for seizures and syncope.  All other systems reviewed and are negative.   Physical Exam Updated Vital Signs BP (!) 146/83 (BP Location: Right Arm)   Pulse (!) 59   Temp 98.1 F (36.7 C) (Oral)   Resp 16   SpO2 94%  Physical Exam Vitals and nursing note reviewed.  Constitutional:      General: She is not in acute distress.    Appearance: She is well-developed.  HENT:     Head: Normocephalic and atraumatic.  Eyes:     Conjunctiva/sclera: Conjunctivae normal.  Cardiovascular:     Rate and Rhythm: Normal rate.     Heart sounds: Normal heart sounds.  Pulmonary:     Effort: Pulmonary effort is normal. No respiratory distress.  Musculoskeletal:        General: No swelling.     Cervical back: Neck supple.  Skin:    General: Skin is warm and dry.     Capillary Refill: Capillary refill takes less than 2 seconds.  Neurological:     Mental Status: She is alert.  Psychiatric:        Mood and Affect: Mood normal.     ED Results / Procedures / Treatments   Labs (all labs ordered are listed, but only abnormal results are displayed) Labs Reviewed - No data to display  EKG None  Radiology CT Head Wo Contrast  Result Date: 10/31/2021 CLINICAL DATA:  Sudden onset of severe headache a few weeks ago, sharp shooting pains from back of head in to temples, denies injury EXAM: CT HEAD WITHOUT CONTRAST TECHNIQUE: Contiguous axial images were obtained from the base of the skull through the vertex without intravenous contrast. RADIATION DOSE REDUCTION: This exam was performed according to the departmental dose-optimization program which includes automated exposure control, adjustment of the mA and/or kV according to patient size and/or use of iterative reconstruction technique. COMPARISON:  None Available. FINDINGS: Brain: Normal ventricular morphology. No midline shift or mass effect. Normal appearance of brain parenchyma. No intracranial  hemorrhage, mass lesion, evidence of acute infarction, or extra-axial fluid collection. Vascular: No hyperdense vessels Skull: Intact Sinuses/Orbits: Clear Other: N/A IMPRESSION: Normal exam. Electronically Signed   By: Lavonia Dana M.D.   On: 10/31/2021 14:47    Procedures Procedures    Medications Ordered in ED Medications - No data to display  ED Course/ Medical Decision Making/ A&P                           Medical Decision Making Amount and/or Complexity of Data Reviewed Radiology: ordered.   46 year old lady presenting to the ER due to concern for headache.  Has been ongoing for 1 month.  Here well-appearing, no distress.  Check CT head, no mass, stroke, bleeding, or other acute intracranial findings noted.  I independently reviewed and interpreted CT imaging.  Reassessed, asymptomatic at present.  Advise follow-up with her PCP for further monitoring.  After the discussed management above, the patient was determined to be safe for discharge.  The patient was in agreement with this plan and all questions regarding their care were answered.  ED return precautions were discussed and the patient will return to the ED with any significant worsening of condition.         Final Clinical Impression(s) / ED Diagnoses Final diagnoses:  Bad headache    Rx / DC Orders ED Discharge Orders     None         Lucrezia Starch, MD 10/31/21 1515

## 2021-10-31 NOTE — Discharge Instructions (Signed)
Take Tylenol, Motrin for your headache as needed.  Follow-up with your primary care doctor.  Back to ER for uncontrolled headache, vomiting, or other new concerning symptom.

## 2021-11-13 ENCOUNTER — Ambulatory Visit
Admission: EM | Admit: 2021-11-13 | Discharge: 2021-11-13 | Disposition: A | Discharge status: Home / Self Care | Payer: Medicaid Other | Attending: Internal Medicine | Admitting: Internal Medicine

## 2021-11-13 DIAGNOSIS — J069 Acute upper respiratory infection, unspecified: Secondary | ICD-10-CM | POA: Diagnosis not present

## 2021-11-13 MED ORDER — BENZONATATE 100 MG PO CAPS: 100.0000 mg | ORAL_CAPSULE | Freq: Three times a day (TID) | ORAL | 0 refills | Status: DC | PRN

## 2021-11-13 MED ORDER — FLUTICASONE PROPIONATE 50 MCG/ACT NA SUSP: 1.0000 | Freq: Every day | NASAL | 0 refills | Status: DC

## 2021-11-13 NOTE — Discharge Instructions (Addendum)
Please maintain adequate hydration Humidifier and vapor rub use helps with nasal congestion and coughing Please take medications as prescribed Please return to urgent care if symptoms worsen There is no indication for antibiotics at this time.

## 2021-11-13 NOTE — ED Triage Notes (Signed)
Pt presents with nasal congestion, headache, and non productive cough for over a week.

## 2021-11-13 NOTE — ED Provider Notes (Signed)
[facility]    CSN: [id number] Arrival date & time: 11/13/21  1031      History   Chief Complaint Chief Complaint  Patient presents with   URI    HPI [name] is a 46 y.o. female comes to the urgent care with a weeks history of nasal congestion, throbbing headache and a nonproductive cough.  Patient's symptoms started insidiously and has been persistent.  She denies any shortness of breath or wheezing.  No fever or chills.  Cough is not productive of sputum.  She endorses postnasal drainage.  No wheezing.  No smoke exposure.  No sick contacts.  Patient is vaccinated against COVID-19 virus.  She has not tried any over-the-counter medication.   HPI  Past Medical History:  Diagnosis Date   Anemia    Anxiety    Arthritis    right foot   Bronchitis    Depressed    Family history of adverse reaction to anesthesia    twin sister - N/V   Heart murmur    was informed of this when she was a teen, has not seen a cardiologist or had an ECHO   Hypertension    Palpitations    Strep pharyngitis     Patient Active Problem List   Diagnosis Date Noted   Osteoarthritis of ankle and foot 05/24/2015   Coalition, tarsal 05/24/2015   Fibroid, uterine 01/27/2015   Unspecified disorder of menstruation and other abnormal bleeding from female genital tract 01/22/2014   Dysmenorrhea 01/22/2014   Cervical high risk human papillomavirus (HPV) DNA test positive 03/12/2013   Vaginitis and vulvovaginitis, unspecified 09/03/2012    Past Surgical History:  Procedure Laterality Date   CHOLECYSTECTOMY N/A 03/16/2021   Procedure: LAPAROSCOPIC CHOLECYSTECTOMY WITH INTRAOPERATIVE CHOLANGIOGRAM;  Surgeon: Jovita Kussmaul, MD;  Location: [place] [facility];  Service: General;  Laterality: N/A;  90 MINUTES   CORONARY ANGIOPLASTY     NO PAST SURGERIES     oral surgery      OB History     Gravida  2   Para  0   Term      Preterm      AB  2   Living  0      SAB  1   IAB  1    Ectopic      Multiple      Live Births               Home Medications    Prior to Admission medications   Medication Sig Start Date End Date Taking? Authorizing Provider  benzonatate (TESSALON) 100 MG capsule Take 1 capsule (100 mg total) by mouth 3 (three) times daily as needed for cough. 11/13/21  Yes Lucresha Dismuke, Myrene Galas, MD  fluticasone (FLONASE) 50 MCG/ACT nasal spray Place 1 spray into both nostrils daily. 11/13/21  Yes Latese Dufault, Myrene Galas, MD  escitalopram (LEXAPRO) 10 MG tablet Take 10 mg by mouth daily.    [provider]  FEROSUL 325 (65 Fe) MG tablet Take 325 mg by mouth daily. 03/09/21   [provider]  hydrochlorothiazide (HYDRODIURIL) 25 MG tablet Take 25 mg by mouth daily.    [provider]  hydrOXYzine (VISTARIL) 25 MG capsule Take 25 mg by mouth at bedtime. 07/13/21   [provider]    Family History Family History  Problem Relation Age of Onset   Heart disease Mother    Bronchitis Mother    Hypertension Sister    Diabetes  Brother    Asthma Other    Hypertension Other     Social History Social History   Tobacco Use   Smoking status: Never   Smokeless tobacco: Never  Vaping Use   Vaping Use: Never used  Substance Use Topics   Alcohol use: Never   Drug use: Never     Allergies   Patient has no known allergies.   Review of Systems Review of Systems  HENT:  Positive for congestion and postnasal drip. Negative for sinus pressure, sinus pain and sore throat.   Eyes: Negative.  Negative for photophobia, redness and visual disturbance.  Respiratory: Negative.    Cardiovascular: Negative.   Gastrointestinal: Negative.   Genitourinary: Negative.   Neurological:  Positive for headaches.     Physical Exam Triage Vital Signs ED Triage Vitals  Enc Vitals Group     BP 11/13/21 1143 (!) 147/105     Pulse Rate 11/13/21 1143 64     Resp 11/13/21 1143 17     Temp 11/13/21 1143 98.2 F (36.8 C)     Temp Source 11/13/21  1143 Oral     SpO2 11/13/21 1143 98 %     Weight --      Height --      Head Circumference --      Peak Flow --      Pain Score 11/13/21 1145 6     Pain Loc --      Pain Edu? --      Excl. in [place]? --    No data found.  Updated Vital Signs BP (!) 147/105 (BP Location: Left Arm)   Pulse 64   Temp 98.2 F (36.8 C) (Oral)   Resp 17   LMP  (LMP Unknown)   SpO2 98%   Visual Acuity Right Eye Distance:   Left Eye Distance:   Bilateral Distance:    Right Eye Near:   Left Eye Near:    Bilateral Near:     Physical Exam Vitals and nursing note reviewed.  Constitutional:      General: She is not in acute distress.    Appearance: She is not ill-appearing.  HENT:     Right Ear: Tympanic membrane normal.     Left Ear: Tympanic membrane normal.     Nose: No rhinorrhea.     Mouth/Throat:     Mouth: Mucous membranes are moist.     Pharynx: No posterior oropharyngeal erythema.  Cardiovascular:     Rate and Rhythm: Normal rate and regular rhythm.     Pulses: Normal pulses.     Heart sounds: Normal heart sounds.  Abdominal:     General: Bowel sounds are normal.     Palpations: Abdomen is soft.  Musculoskeletal:     Cervical back: Normal range of motion.  Neurological:     Mental Status: She is alert.      UC Treatments / Results  Labs (all labs ordered are listed, but only abnormal results are displayed) Labs Reviewed - No data to display  EKG   Radiology No results found.  Procedures Procedures (including critical care time)  Medications Ordered in UC Medications - No data to display  Initial Impression / Assessment and Plan / UC Course  I have reviewed the triage vital signs and the nursing notes.  Pertinent labs & imaging results that were available during my care of the patient were reviewed by me and considered in my medical decision making (see chart for  details).     1.  Viral URI with cough: Humidifier vapor rub use will help with nasal  congestion Maintain adequate hydration Take prescription medications-benzonatate and fluticasone nasal spray No indication for COVID-19 testing Lung exams are reassuring. Return precautions given Final Clinical Impressions(s) / UC Diagnoses   Final diagnoses:  Viral URI with cough     Discharge Instructions      Please maintain adequate hydration Humidifier and vapor rub use helps with nasal congestion and coughing Please take medications as prescribed Please return to urgent care if symptoms worsen There is no indication for antibiotics at this time.   ED Prescriptions     Medication Sig Dispense Auth. Provider   benzonatate (TESSALON) 100 MG capsule Take 1 capsule (100 mg total) by mouth 3 (three) times daily as needed for cough. 30 capsule Mirriam Vadala, Myrene Galas, MD   fluticasone (FLONASE) 50 MCG/ACT nasal spray Place 1 spray into both nostrils daily. 16 g Kwasi Joung, Myrene Galas, MD      PDMP not reviewed this encounter.   Chase Picket, MD 11/13/21 1255

## 2021-11-17 ENCOUNTER — Ambulatory Visit: Payer: Medicaid Other | Admitting: Cardiology

## 2021-11-23 ENCOUNTER — Encounter: Payer: Self-pay | Admitting: Cardiology

## 2021-11-23 ENCOUNTER — Ambulatory Visit: Payer: Medicaid Other | Admitting: Cardiology

## 2021-11-23 VITALS — BP 123/64 | HR 65 | Resp 16 | Ht 65.0 in | Wt 214.0 lb

## 2021-11-23 DIAGNOSIS — I4729 Other ventricular tachycardia: Secondary | ICD-10-CM

## 2021-11-23 DIAGNOSIS — R002 Palpitations: Secondary | ICD-10-CM

## 2021-11-23 DIAGNOSIS — I10 Essential (primary) hypertension: Secondary | ICD-10-CM

## 2021-11-23 DIAGNOSIS — R9439 Abnormal result of other cardiovascular function study: Secondary | ICD-10-CM

## 2021-11-23 DIAGNOSIS — R072 Precordial pain: Secondary | ICD-10-CM

## 2021-11-23 NOTE — Progress Notes (Signed)
ID:  [name], DOB 07-02-1975, MRN [id number]  PCP:  Benito Mccreedy, MD  Cardiologist:  Rex Kras, DO, [facility] (established care 10/05/2021)  Date: 11/23/21 Last Office Visit: 10/05/2021  Chief Complaint  Patient presents with   Results   Follow-up    HPI  [name] is a 46 y.o. female whose  past medical history and cardiovascular risk factors include: Hypertension, anxiety, depression obesity due to excess calories.  The patient was referred to the practice for evaluation of palpitations and precordial pain.  Her symptoms of palpitations started at least a 1 year prior to establishing care, however getting progressively worse, occurring daily, intermittent, lasting for few seconds, no associated near-syncope or syncopal event.  Reversible causes include consumption of significant amount of caffeinated beverages.  She underwent an extended Holter monitor which noted an average heart rate of 60 bpm, had 2 asymptomatic episodes of NSVT and a total supraventricular ectopic burden of approximately 2.9%.  Since last visit patient states that the palpitations are still present but overall intensity, frequency, duration remained stable.  Precordial pain: Likely noncardiac based on symptomatology and underwent exercise treadmill stress test.  The stress test was reported to be abnormal as she had ECG changes concerning for ischemia.  She continues to have intermittent precordial pain, no significant change in physical endurance, nonexertional and does not resolve with rest.  No structured exercise program or daily routine.  Patient is currently disabled.  FUNCTIONAL STATUS: No structured exercise program or daily routine.   ALLERGIES: No Known Allergies  MEDICATION LIST PRIOR TO VISIT: Current Meds  Medication Sig   benzonatate (TESSALON) 100 MG capsule Take 1 capsule (100 mg total) by mouth 3 (three) times daily as needed for cough.   escitalopram (LEXAPRO) 10 MG tablet  Take 10 mg by mouth daily.   FEROSUL 325 (65 Fe) MG tablet Take 325 mg by mouth daily.   fluticasone (FLONASE) 50 MCG/ACT nasal spray Place 1 spray into both nostrils daily.   hydrochlorothiazide (HYDRODIURIL) 25 MG tablet Take 25 mg by mouth daily.   hydrOXYzine (VISTARIL) 25 MG capsule Take 25 mg by mouth at bedtime.     PAST MEDICAL HISTORY: Past Medical History:  Diagnosis Date   Anemia    Anxiety    Arthritis    right foot   Bronchitis    Depressed    Family history of adverse reaction to anesthesia    twin sister - N/V   Heart murmur    was informed of this when she was a teen, has not seen a cardiologist or had an ECHO   Hypertension    Palpitations    Strep pharyngitis     PAST SURGICAL HISTORY: Past Surgical History:  Procedure Laterality Date   CHOLECYSTECTOMY N/A 03/16/2021   Procedure: LAPAROSCOPIC CHOLECYSTECTOMY WITH INTRAOPERATIVE CHOLANGIOGRAM;  Surgeon: Jovita Kussmaul, MD;  Location: [facility] OR;  Service: General;  Laterality: N/A;  90 MINUTES   CORONARY ANGIOPLASTY     NO PAST SURGERIES     oral surgery      FAMILY HISTORY: The patient family history includes Asthma in an other family member; Bronchitis in her mother; Diabetes in her brother; Heart disease in her mother; Hypertension in her sister and another family member.  SOCIAL HISTORY:  The patient  reports that she has never smoked. She has never used smokeless tobacco. She reports that she does not drink alcohol and does not use drugs.  REVIEW OF SYSTEMS: Review of Systems  Constitutional: Positive for malaise/fatigue.  Cardiovascular:  Positive for chest pain and palpitations. Negative for dyspnea on exertion, leg swelling, near-syncope, orthopnea, paroxysmal nocturnal dyspnea and syncope.  Respiratory:  Negative for sleep disturbances due to breathing.   Hematologic/Lymphatic: Negative for bleeding problem.  Neurological:  Negative for dizziness, headaches and light-headedness.    PHYSICAL  EXAM:    11/23/2021    4:15 PM 11/13/2021   11:43 AM 10/31/2021    3:55 PM  Vitals with BMI  Height _0     Weight 214 lbs    BMI 53.29    Systolic 924 268 341  Diastolic 64 962 75  Pulse 65 64 55   Physical Exam  Neck: No JVD present.  Cardiovascular: Normal rate, regular rhythm, S1 normal, S2 normal, intact distal pulses and normal pulses. Exam reveals no gallop, no S3 and no S4.  No murmur heard. Pulmonary/Chest: Effort normal and breath sounds normal. No stridor. She has no wheezes. She has no rales.  Abdominal: Soft. Bowel sounds are normal. She exhibits no distension. There is no abdominal tenderness.  Musculoskeletal:        General: No edema.     CARDIAC DATABASE: EKG: 10/05/2021: Normal sinus rhythm, 61 bpm, normal axis, without underlying ischemic injury pattern.  Echocardiogram: 10/20/2021: Normal LV systolic function with EF 66%. Left ventricle cavity is normal in size. Normal left ventricular wall thickness. Normal global wall motion. No obvious regional wall motion abnormalities. Normal diastolic filling pattern, normal LAP.  No significant valvular heart disease. No prior study for comparison.  Stress Testing: Exercise treadmill stress test 10/06/2021: Exercise treadmill stress test performed using Bruce protocol. Patient reached 7 METS, and 90% of age predicted maximum heart rate. Exercise capacity was low. No chest pain reported. Normal heart rate and hemodynamic response.  Stress EKG showed sinus tachycardia, 1-1.5 mm downsloping ST depression in leads II, III, aVF, 1-1/5 mm horizontal ST depression in leads V4-V6, that partially normalize 2 min into recovery. These changes are positive for ischemia. Abnormal exercise treadmill stress test.   Heart Catheterization: None  Cardiac monitor [facility] Patch): October 20, 2021- October 27, 2021 Dominant rhythm sinus. Heart rate 49-214 bpm. Avg HR 68 bpm. No atrial fibrillation, high grade AV block, pauses (3 seconds or  longer). 2 asymptomatic episodes of NSVT (longest episode 10/23/2021 at 7:53 AM 11 beats, 4.2 seconds, max heart rate 171 bpm and average heart rate 153 bpm) (fastest episode 10/27/2021 at 9:56 AM, 4 beats, 1.6 seconds duration, average heart rate 171 bpm, max heart rate 214 bpm). Rare episodes of paroxysmal supraventricular tachycardia. Total ventricular ectopic burden <1%. Total supraventricular ectopic burden 2.9% (predominantly isolated beats). Patient triggered events: 7. Underlying rhythm sinus with PACs.  LABORATORY DATA:    Latest Ref Rng & Units 05/06/2021    7:43 PM 03/16/2021    9:35 AM 11/21/2020    4:00 PM  CBC  WBC 4.0 - 10.5 K/uL 8.0  9.7  8.6   Hemoglobin 12.0 - 15.0 g/dL 12.3  13.0  12.9   Hematocrit 36.0 - 46.0 % 36.9  39.1  39.5   Platelets 150 - 400 K/uL 325  318  280        Latest Ref Rng & Units 05/06/2021    7:43 PM 03/16/2021    9:35 AM 11/21/2020    4:00 PM  CMP  Glucose 70 - 99 mg/dL 97  99  91   BUN 6 - 20 mg/dL <5  6  <5  Creatinine 0.44 - 1.00 mg/dL 0.89  0.91  0.87   Sodium 135 - 145 mmol/L 135  135  134   Potassium 3.5 - 5.1 mmol/L 3.2  3.0  3.4   Chloride 98 - 111 mmol/L 103  99  103   CO2 22 - 32 mmol/L _0 Calcium 8.9 - 10.3 mg/dL 9.2  8.9  8.9   Total Protein 6.5 - 8.1 g/dL 8.2   8.2   Total Bilirubin 0.3 - 1.2 mg/dL 0.7   0.6   Alkaline Phos 38 - 126 U/L 62   64   AST 15 - 41 U/L 28   22   ALT 0 - 44 U/L 32   26     Lipid Panel     Component Value Date/Time   CHOL 135 01/27/2015 1739   TRIG 60 01/27/2015 1739   HDL 50 01/27/2015 1739    No components found for: "NTPROBNP" No results for input(s): "PROBNP" in the last 8760 hours. No results for input(s): "TSH" in the last 8760 hours.  BMP Recent Labs    03/16/21 0935 05/06/21 1943  NA 135 135  K 3.0* 3.2*  CL 99 103  CO2 27 24  GLUCOSE 99 97  BUN 6 <5*  CREATININE 0.91 0.89  CALCIUM 8.9 9.2  GFRNONAA >60 >60    HEMOGLOBIN A1C Lab Results  Component Value  Date   HGBA1C 5.3 08/26/2019   MPG 111 01/27/2015   External Labs:  Date Collected: 09/04/2021 , information obtained by referring physician.  Potassium: 3.9 Creatinine 0.73 mg/dL. eGFR: 96 mL/min per 1.73 m Hemoglobin: 14.1 g/dL and hematocrit: 42.1 % AST: 15 , ALT: 14 , alkaline phosphatase: 78  TSH: 2.19   D-dimer is 0.23  IMPRESSION:    ICD-10-CM   1. Precordial pain  R07.2 PCV MYOCARDIAL PERFUSION WO LEXISCAN    Pregnancy, urine    2. Palpitations  R00.2     3. NSVT (nonsustained ventricular tachycardia) (HCC)  I47.29     4. Abnormal stress electrocardiogram test using treadmill  R94.39     5. Benign hypertension  I10     6. Class 2 severe obesity due to excess calories with serious comorbidity and body mass index (BMI) of 35.0 to 35.9 in adult [facility])  E66.01    Z68.35        RECOMMENDATIONS: [name] is a 46 y.o. female whose past medical history and cardiac risk factors include: Hypertension, anxiety, depression obesity due to excess calories.  Precordial pain Predominately noncardiac based on symptoms. Echo: Preserved LVEF, normal diastolic function, no significant valvular heart disease. Exercise treadmill stress test: Positive for possible ischemia. Should decision was to proceed with exercise nuclear stress test to evaluate for reversible ischemia given the ECG changes and GXT.  Patient and her fianc are currently planning to have kids but patient is not pregnant.  Would like to check a pregnancy test prior to her nuclear stress test.  Both patient and fianc verbalized understanding Educated on seeking medical attention sooner by going to the closest ER via EMS if the symptoms increase in intensity, frequency, duration, or has typical chest pain as discussed in the office.  Patient verbalized understanding.  Palpitations Extended Holter monitor results reviewed.  Patient was noted to have asymptomatic episodes of NSVT, symptomatic PSVT, and the  supraventricular ectopic burden of approximately 2.9%. Recommended reducing the amount of caffeinated beverages that she consumes on a daily basis. Shared  decision was to hold off on pharmacological therapy for now. Continue to monitor  NSVT (nonsustained ventricular tachycardia) (HCC) Noted to have asymptomatic episodes of NSVT on the current Zio patch, her precordial discomfort likely noncardiac, and her recent GXT concerning for possible ischemia.  Shared decision was to proceed with exercise nuclear stress test for further evaluation. We discussed the role of AV nodal blocking agents; however, patient would like to hold off pharmacological therapy as she is trying to get pregnant.  Abnormal stress electrocardiogram test using treadmill See above  Benign hypertension Office blood pressures are well controlled. Currently on medical therapy. Currently managed by primary care provider.  Class 2 severe obesity due to excess calories with serious comorbidity and body mass index (BMI) of 35.0 to 35.9 in adult [employer]) Body mass index is 35.61 kg/m. I reviewed with the patient the importance of diet, regular physical activity/exercise, weight loss.   Patient is educated on increasing physical activity gradually as tolerated.  With the goal of moderate intensity exercise for 30 minutes a day 5 days a week.  From a cardiovascular standpoint I recommended that she follows up with PCP to be evaluated/screened for diabetes and hyperlipidemia.  Patient verbalizes understanding.  FINAL MEDICATION LIST END OF ENCOUNTER: No orders of the defined types were placed in this encounter.   There are no discontinued medications.    Current Outpatient Medications:    benzonatate (TESSALON) 100 MG capsule, Take 1 capsule (100 mg total) by mouth 3 (three) times daily as needed for cough., Disp: 30 capsule, Rfl: 0   escitalopram (LEXAPRO) 10 MG tablet, Take 10 mg by mouth daily., Disp: , Rfl:    FEROSUL 325  (65 Fe) MG tablet, Take 325 mg by mouth daily., Disp: , Rfl:    fluticasone (FLONASE) 50 MCG/ACT nasal spray, Place 1 spray into both nostrils daily., Disp: 16 g, Rfl: 0   hydrochlorothiazide (HYDRODIURIL) 25 MG tablet, Take 25 mg by mouth daily., Disp: , Rfl:    hydrOXYzine (VISTARIL) 25 MG capsule, Take 25 mg by mouth at bedtime., Disp: , Rfl:   Orders Placed This Encounter  Procedures   Pregnancy, urine   PCV MYOCARDIAL PERFUSION WO LEXISCAN    There are no Patient Instructions on file for this visit.   --Continue cardiac medications as reconciled in final medication list. --Return in about 4 weeks (around 12/21/2021) for Review test results. or sooner if needed. --Continue follow-up with your primary care physician regarding the management of your other chronic comorbid conditions.  Patient's questions and concerns were addressed to her satisfaction. She voices understanding of the instructions provided during this encounter.   This note was created using a voice recognition software as a result there may be grammatical errors inadvertently enclosed that do not reflect the nature of this encounter. Every attempt is made to correct such errors.  Rex Kras, [place], [facility]  Pager: [phone] Office: [phone]

## 2021-12-04 ENCOUNTER — Ambulatory Visit: Payer: Medicaid Other

## 2021-12-04 DIAGNOSIS — R072 Precordial pain: Secondary | ICD-10-CM

## 2021-12-15 ENCOUNTER — Ambulatory Visit: Payer: Medicaid Other | Admitting: Nurse Practitioner

## 2021-12-20 NOTE — Progress Notes (Signed)
Tried calling patient no answer left a vm

## 2021-12-21 ENCOUNTER — Ambulatory Visit: Payer: Medicaid Other | Admitting: Cardiology

## 2021-12-21 ENCOUNTER — Encounter: Payer: Self-pay | Admitting: Cardiology

## 2021-12-21 VITALS — BP 117/71 | HR 64 | Temp 97.6°F | Resp 16 | Ht 65.0 in | Wt 218.0 lb

## 2021-12-21 DIAGNOSIS — E66812 Obesity, class 2: Secondary | ICD-10-CM

## 2021-12-21 DIAGNOSIS — R002 Palpitations: Secondary | ICD-10-CM

## 2021-12-21 DIAGNOSIS — R072 Precordial pain: Secondary | ICD-10-CM

## 2021-12-21 DIAGNOSIS — I10 Essential (primary) hypertension: Secondary | ICD-10-CM

## 2021-12-21 DIAGNOSIS — I4729 Other ventricular tachycardia: Secondary | ICD-10-CM

## 2021-12-21 MED ORDER — DILTIAZEM HCL ER COATED BEADS 120 MG PO CP24: 120.0000 mg | ORAL_CAPSULE | Freq: Every day | ORAL | 0 refills | Status: DC

## 2021-12-21 NOTE — Progress Notes (Signed)
ID:  [name], DOB [birth year], MRN [id number]  PCP:  Benito Mccreedy, MD  Cardiologist:  Rex Kras, DO, [facility] (established care 10/05/2021)  Date: 12/21/21 Last Office Visit: 11/23/2021  Chief Complaint  Patient presents with   Results   Follow-up    HPI  [name] is a 46 y.o. female whose  past medical history and cardiovascular risk factors include: Hypertension, anxiety, depression obesity due to excess calories.  Referred to the practice for evaluation of palpitations and precordial pain.  Her symptoms of palpitations were present a year prior to establishing care and there were increasing in frequency.  Initially it was thought that it may be secondary to significant amount of caffeinated beverages but the symptoms continue despite reducing the consumption.  She underwent an extended Holter monitor which noted supraventricular ectopic burden of approximately 2.9% and 2 asymptomatic episodes of NSVT.  She did undergo exercise treadmill stress test which was concerning for possible ischemia and therefore at her last office visit the shared decision was to proceed with exercise nuclear stress test.  The nuclear stress test was reported to be low risk.  Since last office visit she has not had any chest pain.  And palpitations remain stable with regards to intensity, frequency, and/or duration.  FUNCTIONAL STATUS: No structured exercise program or daily routine.   ALLERGIES: No Known Allergies  MEDICATION LIST PRIOR TO VISIT: Current Meds  Medication Sig   benzonatate (TESSALON) 100 MG capsule Take 1 capsule (100 mg total) by mouth 3 (three) times daily as needed for cough.   diltiazem (CARDIZEM CD) 120 MG 24 hr capsule Take 1 capsule (120 mg total) by mouth daily at 10 pm.   escitalopram (LEXAPRO) 10 MG tablet Take 10 mg by mouth daily.   FEROSUL 325 (65 Fe) MG tablet Take 325 mg by mouth daily.   fluticasone (FLONASE) 50 MCG/ACT nasal spray Place 1 spray into  both nostrils daily.   hydrochlorothiazide (HYDRODIURIL) 25 MG tablet Take 25 mg by mouth daily.   hydrOXYzine (VISTARIL) 25 MG capsule Take 25 mg by mouth at bedtime.     PAST MEDICAL HISTORY: Past Medical History:  Diagnosis Date   Anemia    Anxiety    Arthritis    right foot   Bronchitis    Depressed    Family history of adverse reaction to anesthesia    twin sister - N/V   Heart murmur    was informed of this when she was a teen, has not seen a cardiologist or had an ECHO   Hypertension    Palpitations    Strep pharyngitis     PAST SURGICAL HISTORY: Past Surgical History:  Procedure Laterality Date   CHOLECYSTECTOMY N/A 03/16/2021   Procedure: LAPAROSCOPIC CHOLECYSTECTOMY WITH INTRAOPERATIVE CHOLANGIOGRAM;  Surgeon: Jovita Kussmaul, MD;  Location: [facility] OR;  Service: General;  Laterality: N/A;  90 MINUTES   CORONARY ANGIOPLASTY     NO PAST SURGERIES     oral surgery      FAMILY HISTORY: The patient family history includes Asthma in an other family member; Bronchitis in her mother; Diabetes in her brother; Heart disease in her mother; Hypertension in her sister and another family member.  SOCIAL HISTORY:  The patient  reports that she has never smoked. She has never used smokeless tobacco. She reports that she does not drink alcohol and does not use drugs.  REVIEW OF SYSTEMS: Review of Systems  Constitutional: Positive for malaise/fatigue (better).  Cardiovascular:  Positive for palpitations (intermittent, stable). Negative for chest pain, dyspnea on exertion, leg swelling, near-syncope, orthopnea, paroxysmal nocturnal dyspnea and syncope.  Respiratory:  Negative for sleep disturbances due to breathing.   Hematologic/Lymphatic: Negative for bleeding problem.  Neurological:  Negative for dizziness, headaches and light-headedness.    PHYSICAL EXAM:    12/21/2021    3:14 PM 11/23/2021    4:15 PM 11/13/2021   11:43 AM  Vitals with BMI  Height _0  _1    Weight 218  lbs 214 lbs   BMI 80.32 12.24   Systolic 825 003 704  Diastolic 71 64 888  Pulse 64 65 64   Physical Exam  Neck: No JVD present.  Cardiovascular: Normal rate, regular rhythm, S1 normal, S2 normal, intact distal pulses and normal pulses. Exam reveals no gallop, no S3 and no S4.  No murmur heard. Pulmonary/Chest: Effort normal and breath sounds normal. No stridor. She has no wheezes. She has no rales.  Abdominal: Soft. Bowel sounds are normal. She exhibits no distension. There is no abdominal tenderness.  Musculoskeletal:        General: No edema.     CARDIAC DATABASE: EKG: 10/05/2021: Normal sinus rhythm, 61 bpm, normal axis, without underlying ischemic injury pattern.  Echocardiogram: 10/20/2021: Normal LV systolic function with EF 66%. Left ventricle cavity is normal in size. Normal left ventricular wall thickness. Normal global wall motion. No obvious regional wall motion abnormalities. Normal diastolic filling pattern, normal LAP.  No significant valvular heart disease. No prior study for comparison.  Stress Testing: Exercise treadmill stress test 10/06/2021: Exercise treadmill stress test performed using Bruce protocol. Patient reached 7 METS, and 90% of age predicted maximum heart rate. Exercise capacity was low. No chest pain reported. Normal heart rate and hemodynamic response.  Stress EKG showed sinus tachycardia, 1-1.5 mm downsloping ST depression in leads II, III, aVF, 1-1/5 mm horizontal ST depression in leads V4-V6, that partially normalize 2 min into recovery. These changes are positive for ischemia. Abnormal exercise treadmill stress test.   Exercise Myoview stress test 12/04/2021: Exercise nuclear stress test was performed using Bruce protocol.  1 Day Rest and Stress images. Exercise time 6 minute 35 seconds, achieved 7.96 METS, 94% APMHR.  No angina during exercise. Hypertensive response to exercise: Yes (rest 140/100 and peak 210/100).  Stress ECG positive for  ischemia. Normal myocardial perfusion without convincing evidence of reversible myocardial ischemia or prior infarct in the presence of breast tissue attenuation artifact.  Calculated LVEF 53%, left ventricular size normal, overall wall thickness preserved. Stress ECG positive for ischemia likely secondary to hypertensive response to exercise. Clinical correlation required. No prior studies available for comparison. Low risk study.  Heart Catheterization: None  Cardiac monitor [facility] Patch): October 20, 2021- October 27, 2021 Dominant rhythm sinus. Heart rate 49-214 bpm. Avg HR 68 bpm. No atrial fibrillation, high grade AV block, pauses (3 seconds or longer). 2 asymptomatic episodes of NSVT (longest episode 10/23/2021 at 7:53 AM 11 beats, 4.2 seconds, max heart rate 171 bpm and average heart rate 153 bpm) (fastest episode 10/27/2021 at 9:56 AM, 4 beats, 1.6 seconds duration, average heart rate 171 bpm, max heart rate 214 bpm). Rare episodes of paroxysmal supraventricular tachycardia. Total ventricular ectopic burden <1%. Total supraventricular ectopic burden 2.9% (predominantly isolated beats). Patient triggered events: 7. Underlying rhythm sinus with PACs.  LABORATORY DATA:    Latest Ref Rng & Units 05/06/2021    7:43 PM 03/16/2021    9:35 AM 11/21/2020  4:00 PM  CBC  WBC 4.0 - 10.5 K/uL 8.0  9.7  8.6   Hemoglobin 12.0 - 15.0 g/dL 12.3  13.0  12.9   Hematocrit 36.0 - 46.0 % 36.9  39.1  39.5   Platelets 150 - 400 K/uL 325  318  280        Latest Ref Rng & Units 05/06/2021    7:43 PM 03/16/2021    9:35 AM 11/21/2020    4:00 PM  CMP  Glucose 70 - 99 mg/dL 97  99  91   BUN 6 - 20 mg/dL <5  6  <5   Creatinine 0.44 - 1.00 mg/dL 0.89  0.91  0.87   Sodium 135 - 145 mmol/L 135  135  134   Potassium 3.5 - 5.1 mmol/L 3.2  3.0  3.4   Chloride 98 - 111 mmol/L 103  99  103   CO2 22 - 32 mmol/L _0 Calcium 8.9 - 10.3 mg/dL 9.2  8.9  8.9   Total Protein 6.5 - 8.1 g/dL 8.2   8.2   Total  Bilirubin 0.3 - 1.2 mg/dL 0.7   0.6   Alkaline Phos 38 - 126 U/L 62   64   AST 15 - 41 U/L 28   22   ALT 0 - 44 U/L 32   26     Lipid Panel     Component Value Date/Time   CHOL 135 01/27/2015 1739   TRIG 60 01/27/2015 1739   HDL 50 01/27/2015 1739    No components found for: "NTPROBNP" No results for input(s): "PROBNP" in the last 8760 hours. No results for input(s): "TSH" in the last 8760 hours.  BMP Recent Labs    03/16/21 0935 05/06/21 1943  NA 135 135  K 3.0* 3.2*  CL 99 103  CO2 27 24  GLUCOSE 99 97  BUN 6 <5*  CREATININE 0.91 0.89  CALCIUM 8.9 9.2  GFRNONAA >60 >60    HEMOGLOBIN A1C Lab Results  Component Value Date   HGBA1C 5.3 08/26/2019   MPG 111 01/27/2015   External Labs:  Date Collected: 09/04/2021 , information obtained by referring physician.  Potassium: 3.9 Creatinine 0.73 mg/dL. eGFR: 96 mL/min per 1.73 m Hemoglobin: 14.1 g/dL and hematocrit: 42.1 % AST: 15 , ALT: 14 , alkaline phosphatase: 78  TSH: 2.19   D-dimer is 0.23  IMPRESSION:    ICD-10-CM   1. Precordial pain  R07.2     2. Palpitations  R00.2 diltiazem (CARDIZEM CD) 120 MG 24 hr capsule    3. NSVT (nonsustained ventricular tachycardia) (HCC)  I47.29 diltiazem (CARDIZEM CD) 120 MG 24 hr capsule    4. Benign hypertension  I10     5. Class 2 severe obesity due to excess calories with serious comorbidity and body mass index (BMI) of 36.0 to 36.9 in adult [facility])  E66.01    Z68.36        RECOMMENDATIONS: [name] is a 46 y.o. female whose past medical history and cardiac risk factors include: Hypertension, anxiety, depression obesity due to excess calories.  Precordial pain Resolved since last visit Symptoms were likely noncardiac based on symptoms Echo: Preserved LVEF, normal diastolic function, no significant valvular heart disease. Exercise treadmill stress test: Positive for possible ischemia. Exercise nuclear stress test: Low risk study.  Educated on importance  of improving her modifiable cardiovascular risk factors.  Palpitations Chronic and stable. Extended Holter monitor noted underlying rhythm to  be sinus, supraventricular ectopic burden of approximately 2.9%, and 2 asymptomatic episodes of NSVT Symptoms continue despite reduction of caffeinated beverages. Patient would like to try pharmacological therapy to help with symptom relief. Start Cardizem 120 mg p.o. daily.  Medication profile discussed with the patient. Continue to monitor  NSVT (nonsustained ventricular tachycardia) (HCC) 2 asymptomatic episodes noted on the extended Holter monitor which was performed for palpitations. Has undergone ischemic workup as outlined above. Started on Cardizem at today's office visit.  Benign hypertension Office blood pressures are well controlled. She is not taking hydrochlorothiazide regularly which is currently prescribed by PCP. Currently on medical therapy. Currently managed by primary care provider.  Class 2 severe obesity due to excess calories with serious comorbidity and body mass index (BMI) of 35.0 to 35.9 in adult [facility]) Body mass index is 36.28 kg/m. I reviewed with the patient the importance of diet, regular physical activity/exercise, weight loss.   Patient is educated on increasing physical activity gradually as tolerated.  With the goal of moderate intensity exercise for 30 minutes a day 5 days a week.  From a cardiovascular standpoint I recommended that she follows up with PCP to be evaluated/screened for diabetes and hyperlipidemia.  Patient verbalizes understanding.  FINAL MEDICATION LIST END OF ENCOUNTER: Meds ordered this encounter  Medications   diltiazem (CARDIZEM CD) 120 MG 24 hr capsule    Sig: Take 1 capsule (120 mg total) by mouth daily at 10 pm.    Dispense:  90 capsule    Refill:  0    There are no discontinued medications.    Current Outpatient Medications:    benzonatate (TESSALON) 100 MG capsule, Take 1  capsule (100 mg total) by mouth 3 (three) times daily as needed for cough., Disp: 30 capsule, Rfl: 0   diltiazem (CARDIZEM CD) 120 MG 24 hr capsule, Take 1 capsule (120 mg total) by mouth daily at 10 pm., Disp: 90 capsule, Rfl: 0   escitalopram (LEXAPRO) 10 MG tablet, Take 10 mg by mouth daily., Disp: , Rfl:    FEROSUL 325 (65 Fe) MG tablet, Take 325 mg by mouth daily., Disp: , Rfl:    fluticasone (FLONASE) 50 MCG/ACT nasal spray, Place 1 spray into both nostrils daily., Disp: 16 g, Rfl: 0   hydrochlorothiazide (HYDRODIURIL) 25 MG tablet, Take 25 mg by mouth daily., Disp: , Rfl:    hydrOXYzine (VISTARIL) 25 MG capsule, Take 25 mg by mouth at bedtime., Disp: , Rfl:   No orders of the defined types were placed in this encounter.   There are no Patient Instructions on file for this visit.   --Continue cardiac medications as reconciled in final medication list. --Return in about 6 months (around 06/23/2022) for Follow up palpitation. or sooner if needed. --Continue follow-up with your primary care physician regarding the management of your other chronic comorbid conditions.  Patient's questions and concerns were addressed to her satisfaction. She voices understanding of the instructions provided during this encounter.   This note was created using a voice recognition software as a result there may be grammatical errors inadvertently enclosed that do not reflect the nature of this encounter. Every attempt is made to correct such errors.  Rex Kras, [place], [facility]  Pager: [phone] Office: [phone]

## 2021-12-22 NOTE — Progress Notes (Signed)
Spoke with patient and advised her of Stress test results. Patient did have any questions.  DSK,CMA

## 2022-03-23 ENCOUNTER — Encounter: Payer: Self-pay | Admitting: Obstetrics and Gynecology

## 2022-03-23 ENCOUNTER — Encounter: Payer: Self-pay | Admitting: Nurse Practitioner

## 2022-03-23 ENCOUNTER — Ambulatory Visit: Payer: Medicaid Other | Attending: Nurse Practitioner | Admitting: Nurse Practitioner

## 2022-03-23 VITALS — BP 121/79 | HR 70 | Temp 98.1°F | Wt 219.4 lb

## 2022-03-23 DIAGNOSIS — Z1211 Encounter for screening for malignant neoplasm of colon: Secondary | ICD-10-CM | POA: Diagnosis not present

## 2022-03-23 DIAGNOSIS — E785 Hyperlipidemia, unspecified: Secondary | ICD-10-CM

## 2022-03-23 DIAGNOSIS — Z1231 Encounter for screening mammogram for malignant neoplasm of breast: Secondary | ICD-10-CM

## 2022-03-23 DIAGNOSIS — Z7689 Persons encountering health services in other specified circumstances: Secondary | ICD-10-CM

## 2022-03-23 DIAGNOSIS — M19071 Primary osteoarthritis, right ankle and foot: Secondary | ICD-10-CM

## 2022-03-23 DIAGNOSIS — Z23 Encounter for immunization: Secondary | ICD-10-CM

## 2022-03-23 DIAGNOSIS — F32A Depression, unspecified: Secondary | ICD-10-CM

## 2022-03-23 DIAGNOSIS — F419 Anxiety disorder, unspecified: Secondary | ICD-10-CM

## 2022-03-23 DIAGNOSIS — D219 Benign neoplasm of connective and other soft tissue, unspecified: Secondary | ICD-10-CM

## 2022-03-23 DIAGNOSIS — I10 Essential (primary) hypertension: Secondary | ICD-10-CM | POA: Diagnosis not present

## 2022-03-23 DIAGNOSIS — N939 Abnormal uterine and vaginal bleeding, unspecified: Secondary | ICD-10-CM

## 2022-03-23 MED ORDER — HYDROCHLOROTHIAZIDE 25 MG PO TABS: 25.0000 mg | ORAL_TABLET | Freq: Every day | ORAL | 3 refills | Status: DC

## 2022-03-23 MED ORDER — ESCITALOPRAM OXALATE 20 MG PO TABS: 20.0000 mg | ORAL_TABLET | Freq: Every day | ORAL | 3 refills | Status: DC

## 2022-03-23 MED ORDER — HYDROXYZINE PAMOATE 25 MG PO CAPS: 25.0000 mg | ORAL_CAPSULE | Freq: Three times a day (TID) | ORAL | 2 refills | Status: DC | PRN

## 2022-03-23 NOTE — Progress Notes (Signed)
Scheduled and pt aware.

## 2022-03-23 NOTE — Progress Notes (Addendum)
Assessment & Plan:  Riyah was seen today for establish care.  Diagnoses and all orders for this visit:  Encounter to establish care  Essential hypertension -     hydrochlorothiazide (HYDRODIURIL) 25 MG tablet; Take 1 tablet (25 mg total) by mouth daily. -     CMP14+EGFR  Breast cancer screening by mammogram -     MM 3D SCREEN BREAST BILATERAL; Future  Colon cancer screening -     Ambulatory referral to Gastroenterology  Osteoarthritis of right ankle and foot -     Ambulatory referral to Podiatry  Fibroid -     Ambulatory referral to Gynecology -     US Pelvic Complete With Transvaginal; Future  Abnormal uterine bleeding (AUB) -     Ambulatory referral to Gynecology -     CBC with Differential -     US Pelvic Complete With Transvaginal; Future  Anxiety and depression -     escitalopram (LEXAPRO) 20 MG tablet; Take 1 tablet (20 mg total) by mouth daily. -     hydrOXYzine (VISTARIL) 25 MG capsule; Take 1 capsule (25 mg total) by mouth every 8 (eight) hours as needed. For anxiety. May take 2 capsules at night if needed for insomnia or lack of sleep  Dyslipidemia, goal LDL below 100 -     Lipid panel  Need for immunization against influenza -     Flu Vaccine QUAD 65moIM (Fluarix, Fluzone & Alfiuria Quad PF)    Patient has been counseled on age-appropriate routine health concerns for screening and prevention. These are reviewed and up-to-date. Referrals have been placed accordingly. Immunizations are up-to-date or declined.    Subjective:   Chief Complaint  Patient presents with   Establish Care   HPI JRamona Ruark89y.o. female presents to office today to establish care.   Patient has been counseled on age-appropriate routine health concerns for screening and prevention. These are reviewed and up-to-date. Referrals have been placed accordingly. Immunizations are up-to-date or declined.     MAMMOGRAM: Overdue. Referral placed today Colonoscopy: overdue .  Referral placed today.  PAP SMEAR: UTD  PMH: HTN, Anxiety, depression, obesity, heart palpitations   HTN Blood pressure is well controlled. She is only taking HCTZ and not as prescribed. She had been prescribed diltiazem by cardiology a few months ago for palpitations with dizziness and NSVT but tells me today she is no longer taking due to side effects.  Reports she is still experiencing intermittent palpitations and continues to drink caffeine daily. She has had extensive cardiac workup: ECHO (preserved LVEF) Low risk study for exercise nuclear stress test, holter monitor noted underlying rhythm to be sinus, supraventricular ectopic burden of approximately 2.9%, and 2 asymptomatic episodes of NSVT  BP Readings from Last 3 Encounters:  03/23/22 121/79  12/21/21 117/71  11/23/21 123/64    Anemia She has a history of anemia with uterine fibroids and abnormal bleeding.  [facility] out-of-pocket so she is no longer taking.  She would like to be referred to gynecology for pelvic pain, evaluation of fibroids and reports she is also trying to conceive.  Anxiety and Depression [facility] get an appt made.  She is currently taking escitalopram 20 mg daily and hydroxyzine 25 mg twice daily.  She does note insomnia which I have instructed her to take hydroxyzine 50 mg at night to  see if this will help with her insomnia.  States she was also told her palpitations could be related to her anxiety.  PODIATRY Right foot pain.  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  She was supposed to have an MRI of her foot completed however she was lost to follow-up and would like referral to podiatry today for reevaluation of chronic right foot pain      Review of Systems  Constitutional:  Negative for fever,  malaise/fatigue and weight loss.  HENT: Negative.  Negative for nosebleeds.   Eyes: Negative.  Negative for blurred vision, double vision and photophobia.  Respiratory: Negative.  Negative for cough and shortness of breath.   Cardiovascular:  Positive for palpitations. Negative for chest pain and leg swelling.  Gastrointestinal: Negative.  Negative for heartburn, nausea and vomiting.  Musculoskeletal:  Positive for joint pain. Negative for myalgias.  Neurological:  Positive for dizziness. Negative for focal weakness, seizures and headaches.  Psychiatric/Behavioral:  Positive for depression. Negative for suicidal ideas. The patient has insomnia.     Past Medical History:  Diagnosis Date   Anemia    Anxiety    Arthritis    right foot   Bronchitis    Depressed    Family history of adverse reaction to anesthesia    twin sister - N/V   Heart murmur    was informed of this when she was a teen, has not seen a cardiologist or had an ECHO   Hypertension    Palpitations    Strep pharyngitis     Past Surgical History:  Procedure Laterality Date   CHOLECYSTECTOMY N/A 03/16/2021   Procedure: LAPAROSCOPIC CHOLECYSTECTOMY WITH INTRAOPERATIVE CHOLANGIOGRAM;  Surgeon: Jovita Kussmaul, MD;  Location: [facility] OR;  Service: General;  Laterality: N/A;  58 MINUTES   CORONARY ANGIOPLASTY     NO PAST SURGERIES     oral surgery      Family History  Problem Relation Age of Onset   Heart disease Mother    Bronchitis Mother    Hypertension Sister    Diabetes Brother    Asthma Other    Hypertension Other     Social History Reviewed with no changes to be made today.   Outpatient Medications Prior to Visit  Medication Sig Dispense Refill   fluticasone (FLONASE) 50 MCG/ACT nasal spray Place 1 spray into both nostrils daily. 16 g 0   HYDROcodone-acetaminophen (NORCO/VICODIN) 5-325 MG tablet Take 1 tablet by mouth every 6 (six) hours as needed.     VENTOLIN HFA 108 (90 Base) MCG/ACT inhaler SMARTSIG:2  Puff(s) By Mouth 4 Times Daily PRN     escitalopram (LEXAPRO) 10 MG tablet Take 10 mg by mouth daily.     escitalopram (LEXAPRO) 20 MG tablet Take 20 mg by mouth daily.     fluconazole (DIFLUCAN) 150 MG tablet Take 150 mg by mouth daily.     hydrochlorothiazide (HYDRODIURIL) 25 MG tablet Take 25 mg by mouth daily.     hydrOXYzine (VISTARIL) 25 MG capsule Take 25 mg by mouth at bedtime.     diltiazem (CARDIZEM CD) 120 MG 24 hr capsule Take 1 capsule (120 mg total) by mouth daily at 10 pm. 90 capsule 0   benzonatate (TESSALON) 100 MG capsule Take 1 capsule (100 mg total) by mouth 3 (three) times daily as needed for cough. 30 capsule 0   FEROSUL 325 (65 Fe) MG tablet Take 325 mg by  mouth daily. (Patient not taking: Reported on 03/23/2022)     No facility-administered medications prior to visit.    No Known Allergies     Objective:    BP 121/79   Pulse 70   Temp 98.1 F (36.7 C) (Temporal)   Wt 219 lb 6.4 oz (99.5 kg)   LMP 03/09/2022 (Approximate)   SpO2 99%   BMI 36.51 kg/m  Wt Readings from Last 3 Encounters:  03/23/22 219 lb 6.4 oz (99.5 kg)  12/21/21 218 lb (98.9 kg)  11/23/21 214 lb (97.1 kg)    Physical Exam Vitals and nursing note reviewed.  Constitutional:      Appearance: She is well-developed.  HENT:     Head: Normocephalic and atraumatic.  Cardiovascular:     Rate and Rhythm: Normal rate and regular rhythm.     Heart sounds: Normal heart sounds. No murmur heard.    No friction rub. No gallop.  Pulmonary:     Effort: Pulmonary effort is normal. No tachypnea or respiratory distress.     Breath sounds: Normal breath sounds. No decreased breath sounds, wheezing, rhonchi or rales.  Chest:     Chest wall: No tenderness.  Abdominal:     General: Bowel sounds are normal.     Palpations: Abdomen is soft.  Musculoskeletal:        General: Normal range of motion.     Cervical back: Normal range of motion.  Skin:    General: Skin is warm and dry.  Neurological:      Mental Status: She is alert and oriented to person, place, and time.     Coordination: Coordination normal.  Psychiatric:        Behavior: Behavior normal. Behavior is cooperative.        Thought Content: Thought content normal.        Judgment: Judgment normal.          Patient has been counseled extensively about nutrition and exercise as well as the importance of adherence with medications and regular follow-up. The patient was given clear instructions to go to ER or return to medical center if symptoms don't improve, worsen or new problems develop. The patient verbalized understanding.   Follow-up: Return in 3 months (on 06/23/2022) for physical.   Gildardo Pounds, FNP-BC [facility] [place], [place]   03/23/2022, 12:58 PM

## 2022-03-24 ENCOUNTER — Other Ambulatory Visit: Payer: Self-pay | Admitting: Nurse Practitioner

## 2022-03-24 DIAGNOSIS — I10 Essential (primary) hypertension: Secondary | ICD-10-CM

## 2022-03-24 DIAGNOSIS — I270 Primary pulmonary hypertension: Secondary | ICD-10-CM

## 2022-03-24 LAB — CBC WITH DIFFERENTIAL/PLATELET
Basophils Absolute: 0 10*3/uL (ref 0.0–0.2)
Basos: 0 %
EOS (ABSOLUTE): 0.2 10*3/uL (ref 0.0–0.4)
Eos: 2 %
Hematocrit: 35.9 % (ref 34.0–46.6)
Hemoglobin: 11.5 g/dL (ref 11.1–15.9)
Immature Grans (Abs): 0 10*3/uL (ref 0.0–0.1)
Immature Granulocytes: 0 %
Lymphocytes Absolute: 2.2 10*3/uL (ref 0.7–3.1)
Lymphs: 22 %
MCH: 28.5 pg (ref 26.6–33.0)
MCHC: 32 g/dL (ref 31.5–35.7)
MCV: 89 fL (ref 79–97)
Monocytes Absolute: 1.3 10*3/uL — ABNORMAL HIGH (ref 0.1–0.9)
Monocytes: 13 %
Neutrophils Absolute: 6.3 10*3/uL (ref 1.4–7.0)
Neutrophils: 63 %
Platelets: 352 10*3/uL (ref 150–450)
RBC: 4.04 x10E6/uL (ref 3.77–5.28)
RDW: 13.4 % (ref 11.7–15.4)
WBC: 10.1 10*3/uL (ref 3.4–10.8)

## 2022-03-24 LAB — CMP14+EGFR
ALT: 15 IU/L (ref 0–32)
AST: 15 IU/L (ref 0–40)
Albumin/Globulin Ratio: 0.9 — ABNORMAL LOW (ref 1.2–2.2)
Albumin: 3.8 g/dL — ABNORMAL LOW (ref 3.9–4.9)
Alkaline Phosphatase: 75 IU/L (ref 44–121)
BUN/Creatinine Ratio: 7 — ABNORMAL LOW (ref 9–23)
BUN: 6 mg/dL (ref 6–24)
Bilirubin Total: <0.2 mg/dL (ref 0.0–1.2)
CO2: 26 mmol/L (ref 20–29)
Calcium: 9 mg/dL (ref 8.7–10.2)
Chloride: 99 mmol/L (ref 96–106)
Creatinine, Ser: 0.89 mg/dL (ref 0.57–1.00)
Globulin, Total: 4.2 g/dL (ref 1.5–4.5)
Glucose: 89 mg/dL (ref 70–99)
Potassium: 3.4 mmol/L — ABNORMAL LOW (ref 3.5–5.2)
Sodium: 135 mmol/L (ref 134–144)
Total Protein: 8 g/dL (ref 6.0–8.5)
eGFR: 80 mL/min/{1.73_m2} (ref >59)

## 2022-03-24 LAB — LIPID PANEL
Chol/HDL Ratio: 3.7 ratio (ref 0.0–4.4)
Cholesterol, Total: 137 mg/dL (ref 100–199)
HDL: 37 mg/dL — ABNORMAL LOW (ref >39)
LDL Chol Calc (NIH): 77 mg/dL (ref 0–99)
Triglycerides: 127 mg/dL (ref 0–149)
VLDL Cholesterol Cal: 23 mg/dL (ref 5–40)

## 2022-03-24 MED ORDER — AMLODIPINE BESYLATE 5 MG PO TABS: 5.0000 mg | ORAL_TABLET | Freq: Every day | ORAL | 1 refills | Status: DC

## 2022-03-29 ENCOUNTER — Other Ambulatory Visit: Payer: Medicaid Other

## 2022-04-13 ENCOUNTER — Inpatient Hospital Stay: Admission: RE | Admit: 2022-04-13 | Payer: Medicaid Other | Source: Ambulatory Visit

## 2022-04-25 ENCOUNTER — Ambulatory Visit
Admission: RE | Admit: 2022-04-25 | Discharge: 2022-04-25 | Disposition: A | Discharge status: Home / Self Care | Payer: Medicaid Other | Source: Ambulatory Visit | Attending: Nurse Practitioner | Admitting: Nurse Practitioner

## 2022-04-25 DIAGNOSIS — D219 Benign neoplasm of connective and other soft tissue, unspecified: Secondary | ICD-10-CM

## 2022-04-25 DIAGNOSIS — N939 Abnormal uterine and vaginal bleeding, unspecified: Secondary | ICD-10-CM

## 2022-04-26 ENCOUNTER — Encounter: Payer: Self-pay | Admitting: Nurse Practitioner

## 2022-04-28 ENCOUNTER — Other Ambulatory Visit: Payer: Self-pay | Admitting: Nurse Practitioner

## 2022-04-28 DIAGNOSIS — I10 Essential (primary) hypertension: Secondary | ICD-10-CM

## 2022-04-28 MED ORDER — VALSARTAN 40 MG PO TABS: 20.0000 mg | ORAL_TABLET | Freq: Every day | ORAL | 1 refills | Status: DC

## 2022-05-22 ENCOUNTER — Other Ambulatory Visit: Payer: Self-pay | Admitting: Nurse Practitioner

## 2022-05-22 DIAGNOSIS — F419 Anxiety disorder, unspecified: Secondary | ICD-10-CM

## 2022-05-22 MED ORDER — HYDROCHLOROTHIAZIDE 25 MG PO TABS: 25.0000 mg | ORAL_TABLET | Freq: Every day | ORAL | 3 refills | Status: DC

## 2022-05-22 MED ORDER — HYDROXYZINE PAMOATE 25 MG PO CAPS: 25.0000 mg | ORAL_CAPSULE | Freq: Three times a day (TID) | ORAL | 6 refills | Status: DC | PRN

## 2022-05-22 MED ORDER — POTASSIUM CHLORIDE CRYS ER 10 MEQ PO TBCR: 10.0000 meq | EXTENDED_RELEASE_TABLET | Freq: Every day | ORAL | 3 refills | Status: AC

## 2022-06-05 ENCOUNTER — Ambulatory Visit (INDEPENDENT_AMBULATORY_CARE_PROVIDER_SITE_OTHER): Payer: Commercial Managed Care - HMO | Admitting: Obstetrics and Gynecology

## 2022-06-05 VITALS — BP 144/77 | HR 64

## 2022-06-05 DIAGNOSIS — N979 Female infertility, unspecified: Secondary | ICD-10-CM

## 2022-06-05 DIAGNOSIS — R102 Pelvic and perineal pain: Secondary | ICD-10-CM

## 2022-06-05 NOTE — Progress Notes (Signed)
Message sent to [facility] for in person consultation
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
## 2022-06-06 ENCOUNTER — Ambulatory Visit: Payer: Medicaid Other | Admitting: Clinical

## 2022-06-06 NOTE — BH Specialist Note (Signed)
Error

## 2022-06-08 LAB — ESTRADIOL: Estradiol: 114 pg/mL

## 2022-06-08 LAB — FOLLICLE STIMULATING HORMONE: FSH: 15.9 m[IU]/mL

## 2022-06-08 LAB — TSH RFX ON ABNORMAL TO FREE T4: TSH: 2.23 u[IU]/mL (ref 0.450–4.500)

## 2022-06-08 LAB — HEMOGLOBIN A1C
Est. average glucose Bld gHb Est-mCnc: 111 mg/dL
Hgb A1c MFr Bld: 5.5 % (ref 4.8–5.6)

## 2022-06-08 LAB — ANTI MULLERIAN HORMONE: ANTI-MULLERIAN HORMONE (AMH): 0.06 ng/mL

## 2022-06-08 LAB — PROLACTIN: Prolactin: 39.9 ng/mL — ABNORMAL HIGH (ref 4.8–33.4)

## 2022-06-08 NOTE — BH Specialist Note (Deleted)
Integrated Behavioral Health via Telemedicine Visit  06/08/2022 [name] [id number]  Number of [facility] Clinician visits: No data recorded Session Start time: No data recorded  Session End time: No data recorded Total time in minutes: No data recorded  Referring Provider: Darliss Cheney, MD Patient/Family location: Home*** [facility] Provider location: [facility] [facility]  All persons participating in visit: [name] and [place] ***  Types of Service: {CHL AMB TYPE OF SERVICE:[phone]}  I connected with [name] and/or [name] {family members:[id number]} via  Telephone or Video Enabled Telemedicine Application  (Video is Caregility application) and verified that I am speaking with the correct person using two identifiers. Discussed confidentiality: Yes   I discussed the limitations of telemedicine and the availability of in person appointments.  Discussed there is a possibility of technology failure and discussed alternative modes of communication if that failure occurs.  I discussed that engaging in this telemedicine visit, they consent to the provision of behavioral healthcare and the services will be billed under their insurance.  Patient and/or legal guardian expressed understanding and consented to Telemedicine visit: Yes   Presenting Concerns: Patient and/or family reports the following symptoms/concerns: *** Duration of problem: ***; Severity of problem: {Mild/Moderate/Severe:20260}  Patient and/or Family's Strengths/Protective Factors: {CHL AMB BH PROTECTIVE FACTORS:[phone]}  Goals Addressed: Patient will:  Reduce symptoms of: {IBH Symptoms:21014056}   Increase knowledge and/or ability of: {IBH Patient Tools:21014057}   Demonstrate ability to: {IBH Goals:21014053}  Progress towards Goals: {CHL AMB BH PROGRESS TOWARDS  [phone]}  Interventions: Interventions utilized:  {IBH Interventions:21014054} Standardized Assessments completed: {IBH Screening Tools:21014051}  Patient and/or Family Response: Patient agrees with treatment plan. ***  Assessment: Patient currently experiencing ***.   Patient may benefit from psychoeducation and brief therapeutic interventions regarding coping with symptoms of *** .  Plan: Follow up with behavioral health clinician on : *** Behavioral recommendations:  -*** -*** Referral(s): {IBH Referrals:21014055}  I discussed the assessment and treatment plan with the patient and/or parent/guardian. They were provided an opportunity to ask questions and all were answered. They agreed with the plan and demonstrated an understanding of the instructions.   They were advised to call back or seek an in-person evaluation if the symptoms worsen or if the condition fails to improve as anticipated.  Cindy Hamman Hue Steveson, LCSW     06/05/2022    1:54 PM 03/23/2022    9:50 AM  Depression screen PHQ 2/9  Decreased Interest 2 2  Down, Depressed, Hopeless 2 1  PHQ - 2 Score 4 3  Altered sleeping 3 3  Tired, decreased energy 3 2  Change in appetite 2 0  Feeling bad or failure about yourself  3 3  Trouble concentrating 2 2  Moving slowly or fidgety/restless 2 3  Suicidal thoughts 1 1  PHQ-9 Score 20 17      06/05/2022    1:58 PM 03/23/2022    9:51 AM  GAD 7 : Generalized Anxiety Score  Nervous, Anxious, on Edge 3 3  Control/stop worrying 3 3  Worry too much - different things 3 3  Trouble relaxing 3 3  Restless 2 2  Easily annoyed or irritable 3 3  Afraid - awful might happen 3 3  Total GAD 7 Score 20 20

## 2022-06-12 ENCOUNTER — Other Ambulatory Visit: Payer: Self-pay | Admitting: Obstetrics and Gynecology

## 2022-06-12 DIAGNOSIS — N979 Female infertility, unspecified: Secondary | ICD-10-CM

## 2022-06-21 ENCOUNTER — Encounter: Payer: Medicaid Other | Admitting: Clinical

## 2022-06-21 ENCOUNTER — Ambulatory Visit: Payer: Medicaid Other | Admitting: Cardiology

## 2022-06-22 ENCOUNTER — Ambulatory Visit: Payer: Commercial Managed Care - HMO | Attending: Nurse Practitioner | Admitting: Nurse Practitioner

## 2022-06-22 ENCOUNTER — Encounter: Payer: Self-pay | Admitting: Nurse Practitioner

## 2022-06-22 VITALS — BP 133/82 | HR 71 | Ht 65.0 in | Wt 227.2 lb

## 2022-06-22 DIAGNOSIS — M7918 Myalgia, other site: Secondary | ICD-10-CM | POA: Diagnosis not present

## 2022-06-22 DIAGNOSIS — Z0000 Encounter for general adult medical examination without abnormal findings: Secondary | ICD-10-CM | POA: Diagnosis not present

## 2022-06-22 DIAGNOSIS — Z1231 Encounter for screening mammogram for malignant neoplasm of breast: Secondary | ICD-10-CM

## 2022-06-22 MED ORDER — TIZANIDINE HCL 4 MG PO TABS: 4.0000 mg | ORAL_TABLET | Freq: Four times a day (QID) | ORAL | 0 refills | Status: DC | PRN

## 2022-06-22 NOTE — Progress Notes (Signed)
Assessment & Plan:  [name] was seen today for annual exam.  Diagnoses and all orders for this visit:  Encounter for annual physical exam  Breast cancer screening by mammogram -     MM 3D SCREEN BREAST BILATERAL; Future  Myalgia, multiple sites -     tiZANidine (ZANAFLEX) 4 MG tablet; Take 1 tablet (4 mg total) by mouth every 6 (six) hours as needed for muscle spasms.    Patient has been counseled on age-appropriate routine health concerns for screening and prevention. These are reviewed and up-to-date. Referrals have been placed accordingly. Immunizations are up-to-date or declined.    Subjective:   Chief Complaint  Patient presents with   Annual Exam   HPI [name] 47 y.o. female presents to office today for annual physical exam.  She does have complaints today generalized myalgias.  Patient has been counseled on age-appropriate routine health concerns for screening and prevention. These are reviewed and up-to-date. Referrals have been placed accordingly. Immunizations are up-to-date or declined.     MAMMOGRAM: Overdue. Referral placed Colonoscopy: overdue. Referral placed PAP SMEAR: UTD   PMH: HTN, Anxiety, depression, obesity, heart palpitations    Review of Systems  Constitutional:  Negative for fever, malaise/fatigue and weight loss.  HENT: Negative.  Negative for nosebleeds.   Eyes: Negative.  Negative for blurred vision, double vision and photophobia.  Respiratory: Negative.  Negative for cough and shortness of breath.   Cardiovascular: Negative.  Negative for chest pain, palpitations and leg swelling.  Gastrointestinal: Negative.  Negative for heartburn, nausea and vomiting.  Genitourinary: Negative.   Musculoskeletal:  Positive for myalgias.  Skin: Negative.   Neurological: Negative.  Negative for dizziness, focal weakness, seizures and headaches.  Endo/Heme/Allergies: Negative.   Psychiatric/Behavioral: Negative.  Negative for suicidal ideas.      Past Medical History:  Diagnosis Date   Anemia    Anxiety    Arthritis    right foot   Bronchitis    Depressed    Family history of adverse reaction to anesthesia    twin sister - N/V   Heart murmur    was informed of this when she was a teen, has not seen a cardiologist or had an ECHO   Hypertension    Palpitations    Strep pharyngitis     Past Surgical History:  Procedure Laterality Date   CHOLECYSTECTOMY N/A 03/16/2021   Procedure: LAPAROSCOPIC CHOLECYSTECTOMY WITH INTRAOPERATIVE CHOLANGIOGRAM;  Surgeon: Jovita Kussmaul, MD;  Location: [facility];  Service: General;  Laterality: N/A;  88 MINUTES   CORONARY ANGIOPLASTY     NO PAST SURGERIES     oral surgery      Family History  Problem Relation Age of Onset   Heart disease Mother    Bronchitis Mother    Hypertension Sister    Diabetes Brother    Asthma Other    Hypertension Other     Social History Reviewed with no changes to be made today.   Outpatient Medications Prior to Visit  Medication Sig Dispense Refill   escitalopram (LEXAPRO) 20 MG tablet Take 1 tablet (20 mg total) by mouth daily. 90 tablet 3   fluticasone (FLONASE) 50 MCG/ACT nasal spray Place 1 spray into both nostrils daily. 16 g 0   hydrochlorothiazide (HYDRODIURIL) 25 MG tablet Take 1 tablet (25 mg total) by mouth daily. 90 tablet 3   hydrOXYzine (VISTARIL) 25 MG capsule Take 1 capsule (25 mg total) by mouth every 8 (eight) hours as needed.  For anxiety. May take 2 capsules at night if needed for insomnia or lack of sleep 90 capsule 6   potassium chloride (KLOR-CON M) 10 MEQ tablet Take 1 tablet (10 mEq total) by mouth daily. 90 tablet 3   VENTOLIN HFA 108 (90 Base) MCG/ACT inhaler SMARTSIG:2 Puff(s) By Mouth 4 Times Daily PRN     HYDROcodone-acetaminophen (NORCO/VICODIN) 5-325 MG tablet Take 1 tablet by mouth every 6 (six) hours as needed. (Patient not taking: Reported on 06/22/2022)     No facility-administered medications prior to visit.    No  Known Allergies     Objective:    BP 133/82   Pulse 71   Ht 5' 5"$  (1.651 m)   Wt 227 lb 3.2 oz (103.1 kg)   LMP 06/21/2022   SpO2 100%   BMI 37.81 kg/m  Wt Readings from Last 3 Encounters:  06/22/22 227 lb 3.2 oz (103.1 kg)  03/23/22 219 lb 6.4 oz (99.5 kg)  12/21/21 218 lb (98.9 kg)    Physical Exam Vitals and nursing note reviewed.  Constitutional:      Appearance: She is well-developed.  HENT:     Head: Normocephalic and atraumatic.     Right Ear: Hearing, tympanic membrane, ear canal and external ear normal.     Left Ear: Hearing, tympanic membrane, ear canal and external ear normal.     Nose: Nose normal.     Right Turbinates: Not enlarged.     Left Turbinates: Not enlarged.     Mouth/Throat:     Lips: Pink.     Mouth: Mucous membranes are moist.     Dentition: No dental tenderness, gingival swelling, dental abscesses or gum lesions.     Pharynx: No oropharyngeal exudate.  Eyes:     General: No scleral icterus.       Right eye: No discharge.     Extraocular Movements: Extraocular movements intact.     Conjunctiva/sclera: Conjunctivae normal.     Pupils: Pupils are equal, round, and reactive to light.  Neck:     Thyroid: No thyromegaly.     Trachea: No tracheal deviation.  Cardiovascular:     Rate and Rhythm: Normal rate and regular rhythm.     Heart sounds: Normal heart sounds. No murmur heard.    No friction rub. No gallop.  Pulmonary:     Effort: Pulmonary effort is normal. No tachypnea, accessory muscle usage or respiratory distress.     Breath sounds: Normal breath sounds. No decreased breath sounds, wheezing, rhonchi or rales.  Chest:     Chest wall: No tenderness.  Abdominal:     General: Bowel sounds are normal. There is no distension.     Palpations: Abdomen is soft. There is no mass.     Tenderness: There is no abdominal tenderness. There is no right CVA tenderness, left CVA tenderness, guarding or rebound.     Hernia: No hernia is present.   Musculoskeletal:        General: No tenderness or deformity. Normal range of motion.     Cervical back: Normal range of motion and neck supple.  Lymphadenopathy:     Cervical: No cervical adenopathy.  Skin:    General: Skin is warm and dry.     Findings: No erythema.  Neurological:     Mental Status: She is alert and oriented to person, place, and time.     Cranial Nerves: No cranial nerve deficit.     Motor: Motor function is intact.  Coordination: Coordination is intact. Coordination normal.     Gait: Gait is intact.     Deep Tendon Reflexes:     Reflex Scores:      Patellar reflexes are 1+ on the right side and 1+ on the left side. Psychiatric:        Attention and Perception: Attention normal.        Mood and Affect: Mood normal.        Speech: Speech normal.        Behavior: Behavior normal. Behavior is cooperative.        Thought Content: Thought content normal.        Judgment: Judgment normal.          Patient has been counseled extensively about nutrition and exercise as well as the importance of adherence with medications and regular follow-up. The patient was given clear instructions to go to ER or return to medical center if symptoms don't improve, worsen or new problems develop. The patient verbalized understanding.   Follow-up: Return in about 6 months (around 12/21/2022).   Gildardo Pounds, FNP-BC [facility] [place], [place]   06/22/2022, 1:27 PM

## 2022-06-26 ENCOUNTER — Encounter: Payer: Self-pay | Admitting: Cardiology

## 2022-06-26 ENCOUNTER — Ambulatory Visit: Payer: Medicaid Other | Admitting: Cardiology

## 2022-06-26 VITALS — BP 120/76 | HR 75 | Resp 18 | Ht 65.0 in | Wt 226.4 lb

## 2022-06-26 DIAGNOSIS — I10 Essential (primary) hypertension: Secondary | ICD-10-CM

## 2022-06-26 DIAGNOSIS — R002 Palpitations: Secondary | ICD-10-CM

## 2022-06-26 DIAGNOSIS — I4729 Other ventricular tachycardia: Secondary | ICD-10-CM

## 2022-06-26 MED ORDER — DILTIAZEM HCL 60 MG PO TABS: 60.0000 mg | ORAL_TABLET | Freq: Two times a day (BID) | ORAL | 0 refills | Status: DC | PRN

## 2022-06-26 NOTE — Progress Notes (Signed)
ID:  [name], DOB 11-29-1975, MRN [id number]  PCP:  Gildardo Pounds, NP  Cardiologist:  Rex Kras, DO, [facility] (established care 10/05/2021)  Date: 06/26/22 Last Office Visit: 12/21/2021  Chief Complaint  Patient presents with   Palpitations   Follow-up    6 months     HPI  [name] is a 47 y.o. female whose  past medical history and cardiovascular risk factors include: Hypertension, anxiety, depression obesity due to excess calories.  Patient was referred to the practice for precordial discomfort and palpitations. She is undergone appropriate ischemic workup as outlined below and now presents for 25-monthfollow-up for palpitations. She is undergone a cardiac monitor in the past which noted underlying rhythm to be sinus but she had a PSVT burden of approximately 2.9% and 2 asymptomatic episodes of NSVT.  At the last visit the shared decision was to start Cardizem 120 mg p.o. daily.  She took couple doses of it but had to stop due to dizziness and lightheadedness.  She continues to have daily episodes of a fluttering like sensation in the chest, lasting for few minutes, and self-limited.  No improving or worsening factors.  No near-syncope or syncopal events.  She is also significantly reduced her intake of caffeinated beverages.  She is now consuming water and caffeinated free Sprite.  FUNCTIONAL STATUS: No structured exercise program or daily routine.   ALLERGIES: No Known Allergies  MEDICATION LIST PRIOR TO VISIT: Current Meds  Medication Sig   diltiazem (CARDIZEM) 60 MG tablet Take 1 tablet (60 mg total) by mouth 2 (two) times daily as needed for up to 30 doses (palpitations).   escitalopram (LEXAPRO) 20 MG tablet Take 1 tablet (20 mg total) by mouth daily.   fluticasone (FLONASE) 50 MCG/ACT nasal spray Place 1 spray into both nostrils daily.   hydrochlorothiazide (HYDRODIURIL) 25 MG tablet Take 1 tablet (25 mg total) by mouth daily.   hydrOXYzine  (VISTARIL) 25 MG capsule Take 1 capsule (25 mg total) by mouth every 8 (eight) hours as needed. For anxiety. May take 2 capsules at night if needed for insomnia or lack of sleep   potassium chloride (KLOR-CON M) 10 MEQ tablet Take 1 tablet (10 mEq total) by mouth daily.   tiZANidine (ZANAFLEX) 4 MG tablet Take 1 tablet (4 mg total) by mouth every 6 (six) hours as needed for muscle spasms.   VENTOLIN HFA 108 (90 Base) MCG/ACT inhaler SMARTSIG:2 Puff(s) By Mouth 4 Times Daily PRN     PAST MEDICAL HISTORY: Past Medical History:  Diagnosis Date   Anemia    Anxiety    Arthritis    right foot   Bronchitis    Depressed    Family history of adverse reaction to anesthesia    twin sister - N/V   Heart murmur    was informed of this when she was a teen, has not seen a cardiologist or had an ECHO   Hypertension    Palpitations    Strep pharyngitis     PAST SURGICAL HISTORY: Past Surgical History:  Procedure Laterality Date   CHOLECYSTECTOMY N/A 03/16/2021   Procedure: LAPAROSCOPIC CHOLECYSTECTOMY WITH INTRAOPERATIVE CHOLANGIOGRAM;  Surgeon: TJovita Kussmaul MD;  Location: [facility] OR;  Service: General;  Laterality: N/A;  90 MINUTES   CORONARY ANGIOPLASTY     NO PAST SURGERIES     oral surgery      FAMILY HISTORY: The patient family history includes Asthma in an other family member; Bronchitis in her  mother; Diabetes in her brother; Heart disease in her mother; Hypertension in her sister and another family member.  SOCIAL HISTORY:  The patient  reports that she has never smoked. She has never used smokeless tobacco. She reports that she does not drink alcohol and does not use drugs.  REVIEW OF SYSTEMS: Review of Systems  Cardiovascular:  Positive for palpitations (improved). Negative for chest pain, claudication, dyspnea on exertion, irregular heartbeat, leg swelling, near-syncope, orthopnea, paroxysmal nocturnal dyspnea and syncope.  Respiratory:  Negative for shortness of breath.    Hematologic/Lymphatic: Negative for bleeding problem.  Musculoskeletal:  Negative for muscle cramps and myalgias.  Neurological:  Negative for dizziness and light-headedness.    PHYSICAL EXAM:    06/26/2022    9:57 AM 06/22/2022   10:24 AM 06/05/2022    1:49 PM  Vitals with BMI  Height 5' 5"$  5' 5"$    Weight 226 lbs 6 oz 227 lbs 3 oz   BMI 123456 [id number]   Systolic 123456 [id number]  Diastolic 76 82 77  Pulse 75 71 64   Physical Exam  Neck: No JVD present.  Cardiovascular: Normal rate, regular rhythm, S1 normal, S2 normal, intact distal pulses and normal pulses. Exam reveals no gallop, no S3 and no S4.  No murmur heard. Pulmonary/Chest: Effort normal and breath sounds normal. No stridor. She has no wheezes. She has no rales.  Abdominal: Soft. Bowel sounds are normal. She exhibits no distension. There is no abdominal tenderness.  Musculoskeletal:        General: No edema.     CARDIAC DATABASE: EKG: 06/26/2022: Sinus rhythm, 61 bpm, nonspecific T wave abnormality.  Echocardiogram: 10/20/2021: Normal LV systolic function with EF 66%. Left ventricle cavity is normal in size. Normal left ventricular wall thickness. Normal global wall motion. No obvious regional wall motion abnormalities. Normal diastolic filling pattern, normal LAP.  No significant valvular heart disease. No prior study for comparison.  Stress Testing: Exercise treadmill stress test 10/06/2021: Exercise treadmill stress test performed using Bruce protocol. Patient reached 7 METS, and 90% of age predicted maximum heart rate. Exercise capacity was low. No chest pain reported. Normal heart rate and hemodynamic response.  Stress EKG showed sinus tachycardia, 1-1.5 mm downsloping ST depression in leads II, III, aVF, 1-1/5 mm horizontal ST depression in leads V4-V6, that partially normalize 2 min into recovery. These changes are positive for ischemia. Abnormal exercise treadmill stress test.   Exercise Myoview stress test  12/04/2021: Exercise nuclear stress test was performed using Bruce protocol.  1 Day Rest and Stress images. Exercise time 6 minute 35 seconds, achieved 7.96 METS, 94% APMHR.  No angina during exercise. Hypertensive response to exercise: Yes (rest 140/100 and peak 210/100).  Stress ECG positive for ischemia. Normal myocardial perfusion without convincing evidence of reversible myocardial ischemia or prior infarct in the presence of breast tissue attenuation artifact.  Calculated LVEF 53%, left ventricular size normal, overall wall thickness preserved. Stress ECG positive for ischemia likely secondary to hypertensive response to exercise. Clinical correlation required. No prior studies available for comparison. Low risk study.  Heart Catheterization: None  Cardiac monitor [facility] Patch): October 20, 2021- October 27, 2021 Dominant rhythm sinus. Heart rate 49-214 bpm. Avg HR 68 bpm. No atrial fibrillation, high grade AV block, pauses (3 seconds or longer). 2 asymptomatic episodes of NSVT (longest episode 10/23/2021 at 7:53 AM 11 beats, 4.2 seconds, max heart rate 171 bpm and average heart rate 153 bpm) (fastest episode 10/27/2021 at 9:56 AM, 4 beats,  1.6 seconds duration, average heart rate 171 bpm, max heart rate 214 bpm). Rare episodes of paroxysmal supraventricular tachycardia. Total ventricular ectopic burden <1%. Total supraventricular ectopic burden 2.9% (predominantly isolated beats). Patient triggered events: 7. Underlying rhythm sinus with PACs.  LABORATORY DATA:    Latest Ref Rng & Units 03/23/2022    9:36 AM 05/06/2021    7:43 PM 03/16/2021    9:35 AM  CBC  WBC 3.4 - 10.8 x10E3/uL 10.1  8.0  9.7   Hemoglobin 11.1 - 15.9 g/dL 11.5  12.3  13.0   Hematocrit 34.0 - 46.6 % 35.9  36.9  39.1   Platelets 150 - 450 x10E3/uL 352  325  318        Latest Ref Rng & Units 03/23/2022    9:36 AM 05/06/2021    7:43 PM 03/16/2021    9:35 AM  CMP  Glucose 70 - 99 mg/dL 89  97  99   BUN 6 - 24  mg/dL 6  <5  6   Creatinine 0.57 - 1.00 mg/dL 0.89  0.89  0.91   Sodium 134 - 144 mmol/L 135  135  135   Potassium 3.5 - 5.2 mmol/L 3.4  3.2  3.0   Chloride 96 - 106 mmol/L 99  103  99   CO2 20 - 29 mmol/L 26  24  27   $ Calcium 8.7 - 10.2 mg/dL 9.0  9.2  8.9   Total Protein 6.0 - 8.5 g/dL 8.0  8.2    Total Bilirubin 0.0 - 1.2 mg/dL <0.2  0.7    Alkaline Phos 44 - 121 IU/L 75  62    AST 0 - 40 IU/L 15  28    ALT 0 - 32 IU/L 15  32      Lipid Panel     Component Value Date/Time   CHOL 137 03/23/2022 0936   TRIG 127 03/23/2022 0936   HDL 37 (L) 03/23/2022 0936   CHOLHDL 3.7 03/23/2022 0936   LDLCALC 77 03/23/2022 0936   LABVLDL 23 03/23/2022 0936    No components found for: "NTPROBNP" No results for input(s): "PROBNP" in the last 8760 hours. Recent Labs    06/05/22 1448  TSH 2.230    BMP Recent Labs    03/23/22 0936  NA 135  K 3.4*  CL 99  CO2 26  GLUCOSE 89  BUN 6  CREATININE 0.89  CALCIUM 9.0    HEMOGLOBIN A1C Lab Results  Component Value Date   HGBA1C 5.5 06/05/2022   MPG 111 01/27/2015   External Labs:  Date Collected: 09/04/2021 , information obtained by referring physician.  Potassium: 3.9 Creatinine 0.73 mg/dL. eGFR: 96 mL/min per 1.73 m Hemoglobin: 14.1 g/dL and hematocrit: 42.1 % AST: 15 , ALT: 14 , alkaline phosphatase: 78  TSH: 2.19   D-dimer is 0.23  IMPRESSION:    ICD-10-CM   1. Palpitations  R00.2 EKG 12-Lead    diltiazem (CARDIZEM) 60 MG tablet    2. NSVT (nonsustained ventricular tachycardia) (HCC)  I47.29 EKG 12-Lead    diltiazem (CARDIZEM) 60 MG tablet    3. Benign hypertension  I10     4. Class 2 severe obesity due to excess calories with serious comorbidity and body mass index (BMI) of 37.0 to 37.9 in adult [facility])  E66.01    Z68.37        RECOMMENDATIONS: [name] is a 47 y.o. female whose past medical history and cardiac risk factors include: Hypertension, anxiety,  depression obesity due to excess  calories.  Palpitations Still present intermittently on a daily basis. Overall intensity frequency and duration has not changed over the last 6 months. Was not able to tolerate diltiazem 120 mg p.o. daily due to lightheadedness. Cardiac monitor in the past has noted underlying rhythm to be sinus with supraventricular ectopic burden 2.9% and 2 asymptomatic episodes of NSVT. She has implemented lifestyle changes with reduction of caffeinated beverages. Will give her diltiazem 60 mg p.o. twice daily as needed basis for palpitations. Monitor for now. No syncopal events.  NSVT (nonsustained ventricular tachycardia) (HCC) Asymptomatic. Start diltiazem short acting as discussed above. Echo: Preserved LVEF, normal diastolic function, no significant valvular heart disease. Exercise treadmill stress test: Positive for possible ischemia. Exercise nuclear stress test: Low risk study.  Educated on importance of improving her modifiable cardiovascular risk factors.  Benign hypertension Office blood pressures are well-controlled. No changes warranted at this time.  Class 2 severe obesity due to excess calories with serious comorbidity and body mass index (BMI) of 37.0 to 37.9 in adult [facility]) Body mass index is 37.67 kg/m. Has gained 8 pounds since last office visit, over 6 months. I reviewed with her importance of diet, regular physical activity/exercise, weight loss.   Patient is educated on the importance of increasing physical activity gradually as tolerated with a goal of moderate intensity exercise for 30 minutes a day 5 days a week.   FINAL MEDICATION LIST END OF ENCOUNTER: Meds ordered this encounter  Medications   diltiazem (CARDIZEM) 60 MG tablet    Sig: Take 1 tablet (60 mg total) by mouth 2 (two) times daily as needed for up to 30 doses (palpitations).    Dispense:  30 tablet    Refill:  0    There are no discontinued medications.    Current Outpatient Medications:    diltiazem  (CARDIZEM) 60 MG tablet, Take 1 tablet (60 mg total) by mouth 2 (two) times daily as needed for up to 30 doses (palpitations)., Disp: 30 tablet, Rfl: 0   escitalopram (LEXAPRO) 20 MG tablet, Take 1 tablet (20 mg total) by mouth daily., Disp: 90 tablet, Rfl: 3   fluticasone (FLONASE) 50 MCG/ACT nasal spray, Place 1 spray into both nostrils daily., Disp: 16 g, Rfl: 0   hydrochlorothiazide (HYDRODIURIL) 25 MG tablet, Take 1 tablet (25 mg total) by mouth daily., Disp: 90 tablet, Rfl: 3   hydrOXYzine (VISTARIL) 25 MG capsule, Take 1 capsule (25 mg total) by mouth every 8 (eight) hours as needed. For anxiety. May take 2 capsules at night if needed for insomnia or lack of sleep, Disp: 90 capsule, Rfl: 6   potassium chloride (KLOR-CON M) 10 MEQ tablet, Take 1 tablet (10 mEq total) by mouth daily., Disp: 90 tablet, Rfl: 3   tiZANidine (ZANAFLEX) 4 MG tablet, Take 1 tablet (4 mg total) by mouth every 6 (six) hours as needed for muscle spasms., Disp: 60 tablet, Rfl: 0   VENTOLIN HFA 108 (90 Base) MCG/ACT inhaler, SMARTSIG:2 Puff(s) By Mouth 4 Times Daily PRN, Disp: , Rfl:   Orders Placed This Encounter  Procedures   EKG 12-Lead     There are no Patient Instructions on file for this visit.   --Continue cardiac medications as reconciled in final medication list. --Return in about 6 months (around 12/25/2022) for Follow up, Palpitations. or sooner if needed. --Continue follow-up with your primary care physician regarding the management of your other chronic comorbid conditions.  Patient's questions and concerns were  addressed to her satisfaction. She voices understanding of the instructions provided during this encounter.   This note was created using a voice recognition software as a result there may be grammatical errors inadvertently enclosed that do not reflect the nature of this encounter. Every attempt is made to correct such errors.  Rex Kras, [place], [facility]  Pager: [phone] Office:  [phone]
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
## 2022-07-06 ENCOUNTER — Encounter: Payer: Self-pay | Admitting: Obstetrics and Gynecology

## 2022-07-10 ENCOUNTER — Ambulatory Visit: Payer: Commercial Managed Care - HMO | Admitting: Clinical

## 2022-07-10 DIAGNOSIS — F322 Major depressive disorder, single episode, severe without psychotic features: Secondary | ICD-10-CM | POA: Diagnosis not present

## 2022-07-10 DIAGNOSIS — F410 Panic disorder [episodic paroxysmal anxiety] without agoraphobia: Secondary | ICD-10-CM

## 2022-07-10 NOTE — Patient Instructions (Signed)
Center for [facility] [place], [place] [address] [phone] (main office) [phone] ([employer] office)  Infertility Support Group www.[email or web address].net  /Emotional [employer] and Websites Here are a few free apps meant to help you to help yourself.  To find, try searching on the internet to see if the app is offered on Apple/Android devices. If your first choice doesn't come up on your device, the good news is that there are many choices! Play around with different apps to see which ones are helpful to you.    Calm This is an app meant to help increase calm feelings. Includes info, strategies, and tools for tracking your feelings.      Calm Harm  This app is meant to help with self-harm. Provides many 5-minute or 15-min coping strategies for doing instead of hurting yourself.       Vayas is a problem-solving tool to help deal with emotions and cope with stress you encounter wherever you are.      MindShift This app can help people cope with anxiety. Rather than trying to avoid anxiety, you can make an important shift and face it.      MY3  MY3 features a support system, safety plan and resources with the goal of offering a tool to use in a time of need.       My Life My Voice  This mood journal offers a simple solution for tracking your thoughts, feelings and moods. Animated emoticons can help identify your mood.       Relax Melodies Designed to help with sleep, on this app you can mix sounds and meditations for relaxation.      Smiling Mind Smiling Mind is meditation made easy: it's a simple tool that helps put a smile on your mind.        Stop, Breathe & Think  A friendly, simple guide for people through meditations for mindfulness and compassion.  Stop, Breathe and Think Kids Enter your current feelings and choose a "mission" to help you cope. Offers videos for certain moods  instead of just sound recordings.       Team Orange The goal of this tool is to help teens change how they think, act, and react. This app helps you focus on your own good feelings and experiences.      The [place] Box The [place] Box (VHB) contains simple tools to help patients with coping, relaxation, distraction, and positive thinking.

## 2022-07-17 NOTE — BH Specialist Note (Signed)
Integrated Behavioral Health via Telemedicine Visit  07/31/2022 [name] [id number]  Number of [place] Clinician visits: 2- Second Visit  Session Start time: 4696   Session End time: 2952  Total time in minutes: 33  Referring Provider: Darliss Cheney, MD Patient/Family location: Home [facility] Provider location: Center for [place] [facility]  All persons participating in visit: [name] and [place]   Types of Service: Individual psychotherapy and Telephone visit  I connected with [name] and/or [name]  fiance  via  Telephone or Video Enabled Telemedicine Application  (Video is Caregility application) and verified that I am speaking with the correct person using two identifiers. Discussed confidentiality: Yes   I discussed the limitations of telemedicine and the availability of in person appointments.  Discussed there is a possibility of technology failure and discussed alternative modes of communication if that failure occurs.  I discussed that engaging in this telemedicine visit, they consent to the provision of behavioral healthcare and the services will be billed under their insurance.  Patient and/or legal guardian expressed understanding and consented to Telemedicine visit: Yes   Presenting Concerns: Patient and/or family reports the following symptoms/concerns: Able to step fully onto balcony once in the past week and for a nighttime walk with fiance; social anxiety when outdoors; pt open to taking an outdoor walk right now while on phone.  Duration of problem: Increasing social anxiety and depression over time; Severity of problem:  moderately severe  Patient and/or Family's Strengths/Protective Factors: Social connections, Concrete supports in place (healthy food, safe environments, etc.), and Sense of purpose  Goals Addressed: Patient will:  Reduce symptoms of:  anxiety, depression, and stress   Increase knowledge and/or ability of: self-management skills   Demonstrate ability to: Increase motivation to adhere to plan of care  Progress towards Goals: Ongoing  Interventions: Interventions utilized:  Behavioral Activation Standardized Assessments completed: Not Needed  Patient and/or Family Response: Patient agrees with treatment plan.   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  Plan: Follow up with behavioral health clinician on : Two weeks Behavioral recommendations:  -Continue daily self-coping strategies (prayer, weekly church video service, relaxation breathing) -Continue outdoor walk today with fiance; consider future walks while talking on the phone, as discussed Referral(s): [place] (In Clinic)  I discussed the assessment and treatment plan with the patient and/or parent/guardian. They were provided an opportunity to ask questions and all were answered. They agreed with the plan and demonstrated an understanding of the instructions.   They were advised to call back or seek an in-person evaluation if the symptoms worsen or if the condition fails to improve as anticipated.  [place], LCSW     06/22/2022   10:30 AM 06/05/2022    1:54 PM 03/23/2022    9:50 AM  Depression screen PHQ 2/9  Decreased Interest 2 2 2   Down, Depressed, Hopeless 2 2 1   PHQ - 2 Score 4 4 3   Altered sleeping 2 3 3   Tired, decreased energy 2 3 2   Change in appetite 0 2 0  Feeling bad or failure about yourself  1 3 3   Trouble concentrating 2 2 2   Moving slowly or fidgety/restless 2 2 3   Suicidal thoughts 2 1 1   PHQ-9 Score 15 20 17       06/22/2022  10:30 AM 06/05/2022    1:58 PM 03/23/2022    9:51 AM  GAD 7 : Generalized Anxiety Score   Nervous, Anxious, on Edge 3 3 3   Control/stop worrying 3 3 3   Worry too much - different things 2 3 3   Trouble relaxing 2 3 3   Restless 2 2 2   Easily annoyed or irritable 2 3 3   Afraid - awful might happen 2 3 3   Total GAD 7 Score 16 20 20

## 2022-07-31 ENCOUNTER — Ambulatory Visit: Payer: Commercial Managed Care - HMO | Admitting: Clinical

## 2022-07-31 DIAGNOSIS — F410 Panic disorder [episodic paroxysmal anxiety] without agoraphobia: Secondary | ICD-10-CM

## 2022-07-31 DIAGNOSIS — F322 Major depressive disorder, single episode, severe without psychotic features: Secondary | ICD-10-CM

## 2022-08-03 LAB — HM COLONOSCOPY

## 2022-08-06 NOTE — BH Specialist Note (Signed)
Integrated Behavioral Health via Telemedicine Visit  08/20/2022 Cindy MelenaJuliet Herman [id number]  Number of Integrated Behavioral Health Clinician visits: 3- Third Visit  Session Start time: 1530   Session End time: 1544  Total time in minutes: 14   Referring Provider: Lorriane Shirehristana Ajewole, MD [facility]  All persons participating in visit: [name] and [facility] Tracey Stewart   Types of Service: Individual psychotherapy and Telephone visit  I connected with Cindy MelenaJuliet Herman and/or Cindy PoliceJuliet Herman's  n/a  via  Telephone or Video Enabled Telemedicine Application  (Video is Caregility application) and verified that I am speaking with the correct person using two identifiers. Discussed confidentiality: Yes   I discussed the limitations of telemedicine and the availability of in person appointments.  Discussed there is a possibility of technology failure and discussed alternative modes of communication if that failure occurs.  I discussed that engaging in this telemedicine visit, they consent to the provision of behavioral healthcare and the services will be billed under their insurance.  Patient and/or legal guardian expressed understanding and consented to Telemedicine visit: Yes   Presenting Concerns: Patient and/or family reports the following symptoms/concerns: Increased anxiety after receiving "social services fraud" letter, saying she owes $2000 due to a misunderstanding/paperwork error. Pt has been able to take two walks outdoors with fiance within the past two weeks with reduced anxious feelings from prior times.  Duration of problem: Ongoing; Severity of problem:  moderately severe  Patient and/or Family's Strengths/Protective Factors: Social connections, Concrete supports in place (healthy food, safe environments, etc.), and Sense of purpose  Goals  Addressed: Patient will:  Reduce symptoms of: anxiety, depression, and stress    Demonstrate ability to: Increase motivation to adhere to plan of care  Progress towards Goals: Ongoing  Interventions: Interventions utilized:  Motivational Interviewing Standardized Assessments completed: Not Needed  Patient and/or Family Response: Patient agrees with treatment plan.   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 .   Patient may benefit from continued therapeutic intervenions.  Plan: Follow up with behavioral health clinician on : Two weeks Behavioral recommendations:  -Continue weekly walks with fiance outdoors; consider increasing to twice weekly with fiance or while on phone with friend/family -Continue daily self-coping (prayer, weekly church video service, relaxation breathing) -Go to DSS and inquire about letter (make sure it's not a scam letter; find out next step options to pay, if it's not a scam) Referral(s): [facility] (In [facility])  I discussed the assessment and treatment plan with the patient and/or parent/guardian. They were provided an opportunity to ask questions and all were answered. They agreed with the plan and demonstrated an understanding of the instructions.   They were advised to call back or seek an in-person evaluation if the symptoms worsen or if the condition fails to improve as anticipated.  Valetta CloseJamie C Shayona Hibbitts, LCSW     06/22/2022   10:30 AM 06/05/2022    1:54 PM 03/23/2022    9:50 AM  Depression screen PHQ 2/9  Decreased Interest 2 2 2   Down, Depressed, Hopeless 2 2 1   PHQ - 2 Score 4 4 3   Altered sleeping 2 3 3   Tired, decreased energy 2 3 2   Change in appetite 0 2 0  Feeling bad or failure about yourself  1 3 3   Trouble concentrating 2 2 2   Moving slowly or fidgety/restless  2 2 3   Suicidal thoughts 2 1 1    PHQ-9 Score 15 20 17       06/22/2022   10:30 AM 06/05/2022    1:58 PM 03/23/2022    9:51 AM  GAD 7 : Generalized Anxiety Score  Nervous, Anxious, on Edge 3 3 3   Control/stop worrying 3 3 3   Worry too much - different things 2 3 3   Trouble relaxing 2 3 3   Restless 2 2 2   Easily annoyed or irritable 2 3 3   Afraid - awful might happen 2 3 3   Total GAD 7 Score 16 20 20

## 2022-08-20 ENCOUNTER — Ambulatory Visit (INDEPENDENT_AMBULATORY_CARE_PROVIDER_SITE_OTHER): Payer: Medicaid Other | Admitting: Clinical

## 2022-08-20 DIAGNOSIS — F411 Generalized anxiety disorder: Secondary | ICD-10-CM | POA: Diagnosis not present

## 2022-08-20 DIAGNOSIS — F410 Panic disorder [episodic paroxysmal anxiety] without agoraphobia: Secondary | ICD-10-CM

## 2022-08-20 DIAGNOSIS — F322 Major depressive disorder, single episode, severe without psychotic features: Secondary | ICD-10-CM | POA: Diagnosis not present

## 2022-08-21 NOTE — BH Specialist Note (Signed)
Integrated Behavioral Health via Telemedicine Visit  09/03/2022 [name] [id number]  Number of Integrated Behavioral Health Clinician visits: 4- Fourth Visit  Session Start time: 1404   Session End time: 1429  Total time in minutes: 25   Referring Provider: Lorriane Shire, [facility] Patient/Family location: Home [facility] Provider location: Center for [facility]  All persons participating in visit: [name] and [name] [name]   Types of Service: Individual psychotherapy and Telephone visit  I connected with [name] and/or [name]  n/a  via  Telephone or Video Enabled Telemedicine Application  (Video is Caregility application) and verified that I am speaking with the correct person using two identifiers. Discussed confidentiality: Yes   I discussed the limitations of telemedicine and the availability of in person appointments.  Discussed there is a possibility of technology failure and discussed alternative modes of communication if that failure occurs.  I discussed that engaging in this telemedicine visit, they consent to the provision of behavioral healthcare and the services will be billed under their insurance.  Patient and/or legal guardian expressed understanding and consented to Telemedicine visit: Yes   Presenting Concerns: Patient and/or family reports the following symptoms/concerns: Depression, anxiety, passive SI with no plan and no intent; agrees to referral to psychiatry. Pt is taking Lexapro as prescribed; has stayed in bed for past three days with little motivation; did step outside with fiance at least once the past week; mom and fiance supportive; time with nephew/nieces helps improve mood greatly.  Duration of problem: Ongoing; Severity of problem: severe  Patient and/or Family's Strengths/Protective Factors: Social connections, Concrete supports in place (healthy food, safe  environments, etc.), and Sense of purpose  Goals Addressed: Patient will:  Reduce symptoms of: anxiety and depression    Demonstrate ability to: Increase motivation to adhere to plan of care  Progress towards Goals: Ongoing  Interventions: Interventions utilized:  Motivational Interviewing and Behavioral Activation Standardized Assessments completed: GAD-7 and PHQ 9  Patient and/or Family Response: Patient agrees with treatment plan.   Assessment: Patient currently experiencing Major depressive disorder, recurrent, severe without psychotic features; Generalized anxiety disorder  Patient may benefit from continued therapeutic interventions; referral to psychiatry  Plan: Follow up with behavioral health clinician on : Two weeks Behavioral recommendations:  -Continue taking Lexapro as prescribed -Accept referral to psychiatry; use [facility] walk-in hours to establish care with psychiatry, or for [facility] Ur[facility], if SI returns -Continue outdoor time with fiance as often as able (daily preferred) -Continue keeping in touch with mom, nephews and nieces at least weekly (daily preferred) -Consider sitting outdoors for at least one minute daily -If unable to sit outdoors in daytime, consider moving onto couch during daytime, rather than bedroom Referral(s): Integrated [employer] (In Clinic) and [facility] (LME/Outside Clinic)  I discussed the assessment and treatment plan with the patient and/or parent/guardian. They were provided an opportunity to ask questions and all were answered. They agreed with the plan and demonstrated an understanding of the instructions.   They were advised to call back or seek an in-person evaluation if the symptoms worsen or if the condition fails to improve as anticipated.  Valetta Close Kariana Wiles, LCSW     09/03/2022    2:07 PM 06/22/2022   10:30 AM 06/05/2022    1:54 PM 03/23/2022    9:50 AM  Depression screen PHQ 2/9   Decreased Interest Down, Depressed, Hopeless 3 2  2 1  PHQ - 2 Score Altered sleeping Tired, decreased energy Change in appetite 0 0 2 0  Feeling bad or failure about yourself  Trouble concentrating Moving slowly or fidgety/restless 0 Suicidal thoughts PHQ-9 Score 09/03/2022    2:11 PM 06/22/2022   10:30 AM 06/05/2022    1:58 PM 03/23/2022    9:51 AM  GAD 7 : Generalized Anxiety Score  Nervous, Anxious, on Edge Control/stop worrying 0 Worry too much - different things Trouble relaxing Restless Easily annoyed or irritable Afraid - awful might happen Total GAD 7 Score 20

## 2022-09-03 ENCOUNTER — Ambulatory Visit: Payer: Medicaid Other | Admitting: Clinical

## 2022-09-03 ENCOUNTER — Telehealth: Payer: Self-pay | Admitting: Nurse Practitioner

## 2022-09-03 ENCOUNTER — Other Ambulatory Visit: Payer: Self-pay | Admitting: Nurse Practitioner

## 2022-09-03 DIAGNOSIS — F410 Panic disorder [episodic paroxysmal anxiety] without agoraphobia: Secondary | ICD-10-CM | POA: Diagnosis not present

## 2022-09-03 DIAGNOSIS — F322 Major depressive disorder, single episode, severe without psychotic features: Secondary | ICD-10-CM | POA: Insufficient documentation

## 2022-09-03 DIAGNOSIS — F411 Generalized anxiety disorder: Secondary | ICD-10-CM

## 2022-09-03 DIAGNOSIS — F332 Major depressive disorder, recurrent severe without psychotic features: Secondary | ICD-10-CM

## 2022-09-03 DIAGNOSIS — F32A Depression, unspecified: Secondary | ICD-10-CM

## 2022-09-03 MED ORDER — ESCITALOPRAM OXALATE 20 MG PO TABS: 40.0000 mg | ORAL_TABLET | Freq: Every day | ORAL | 1 refills | Status: DC

## 2022-09-03 NOTE — Telephone Encounter (Signed)
-----   Message from Rae Lips, [place] sent at 09/03/2022  5:48 PM EDT ----- Pt is taking Lexapro daily as prescribed; symptoms are not improving and I've referred her to psychiatry. She seems reluctant to use walk-in hours at [facility] and it may be two months out post-referral for an official appointment.   She wanted you to know, since you are prescribing, in case you wanted to make any changes prior to her being seen by psychiatry.   Thank you for all you do,  Asher Muir

## 2022-09-03 NOTE — Patient Instructions (Signed)
[facility] at  [facility] [address] [place], [place] [address] [phone] (main office) [phone] (Fortune Brannigan's office)  [facility]  [address], [place], [place] [address] [phone] or [phone] WALK-IN URGENT CARE 24/7 FOR ANYONE [address], , [place]  [phone] Fax: [phone] [email or web address] *Interpreters available *Accepts all insurance and uninsured for Urgent Care needs *Accepts Medicaid and uninsured for outpatient treatment (below)    ONLY FOR [place] Residents  Below:   Outpatient New Patient Assessment/Therapy Walk-ins:        Monday -Thursday 8am until slots are full.        Every Friday 1pm-4pm  (first come, first served)                   New Patient Psychiatry/Medication Management        Monday-Friday 8am-11am (first come, first served)              For all walk-ins we ask that you arrive by 7:15am, because patients will be seen in the order of arrival.     

## 2022-09-04 NOTE — Telephone Encounter (Signed)
Patient identified by name and date of birth.   Patient aware of response and voiced understanding.   

## 2022-09-04 NOTE — BH Specialist Note (Signed)
Pt did not arrive to video visit and did not answer the phone; Left HIPPA-compliant message to call back Nataliyah Packham from [facility] at  [phone] (Ryn Peine's office).  ?; left MyChart message for patient.  ? ?

## 2022-09-17 ENCOUNTER — Ambulatory Visit: Payer: Medicaid Other | Admitting: Clinical

## 2022-09-17 DIAGNOSIS — Z91199 Patient's noncompliance with other medical treatment and regimen due to unspecified reason: Secondary | ICD-10-CM

## 2022-11-12 ENCOUNTER — Telehealth: Payer: Self-pay | Admitting: Clinical

## 2022-11-12 DIAGNOSIS — F410 Panic disorder [episodic paroxysmal anxiety] without agoraphobia: Secondary | ICD-10-CM

## 2022-11-12 DIAGNOSIS — F332 Major depressive disorder, recurrent severe without psychotic features: Secondary | ICD-10-CM

## 2022-11-12 NOTE — Telephone Encounter (Signed)
Returned pt call; pt requesting emotional support letter after obtaining puppy; pt agrees to referral to psychiatry at [facility]; will use walk-in hours for timely access.

## 2022-12-21 ENCOUNTER — Ambulatory Visit: Payer: Commercial Managed Care - HMO | Admitting: Nurse Practitioner

## 2022-12-25 ENCOUNTER — Ambulatory Visit: Payer: Medicaid Other | Admitting: Cardiology

## 2023-01-22 ENCOUNTER — Ambulatory Visit (HOSPITAL_COMMUNITY): Payer: Medicaid Other | Admitting: Clinical

## 2023-01-31 IMAGING — CT CT HEAD W/O CM
4 series · 17 of 47 positions shown, 19 images · non-contrast
Comparison: None Available.

CLINICAL DATA: Sudden onset of severe headache a few weeks ago,
sharp shooting pains from back of head in to temples, denies injury



[Series 3: head without · axial · non-contrast · 0.46mm/px · z∈[-108,+12]mm · 7 of 34 slices shown, 9 images]
[im 5/34  brain]
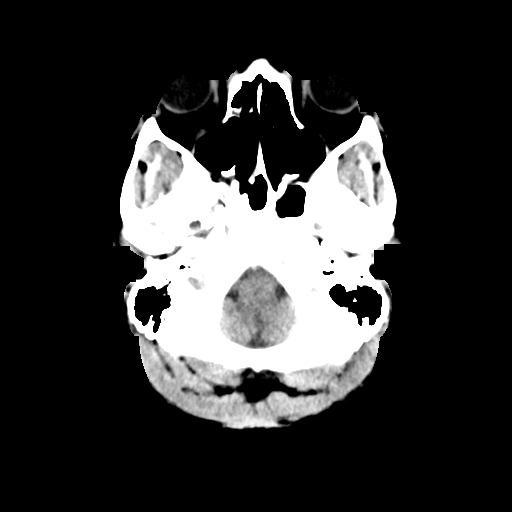
[im 5/34  bone]
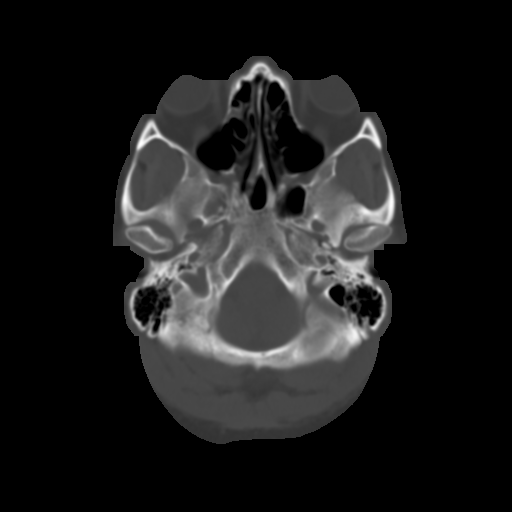
[im 9/34  brain]
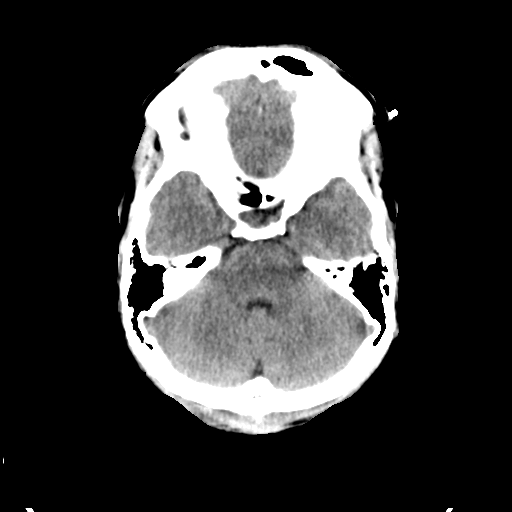
[im 13/34  brain]
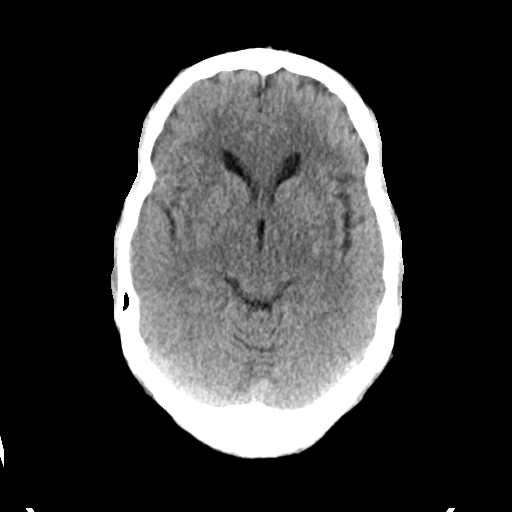
[im 17/34  brain]
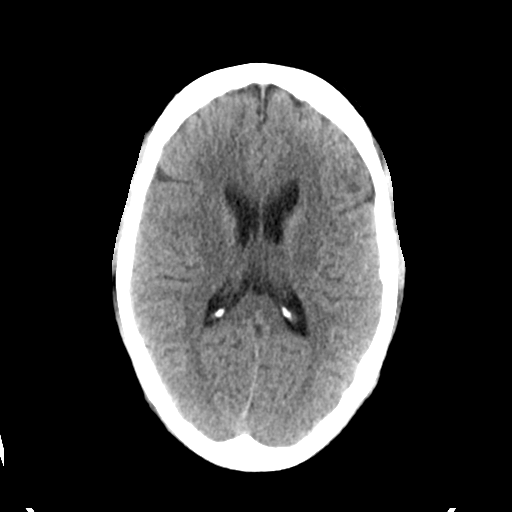
[im 21/34  brain]
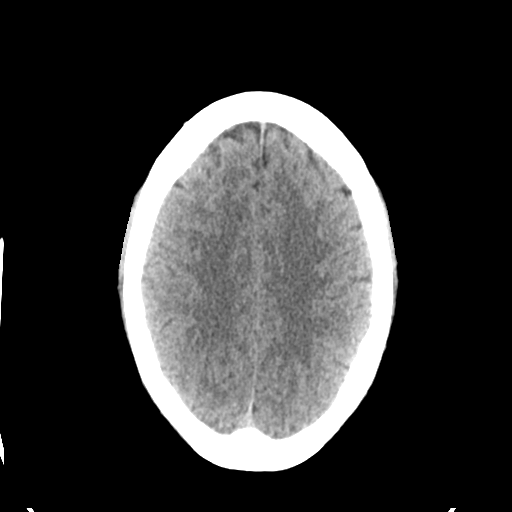
[im 21/34  bone]
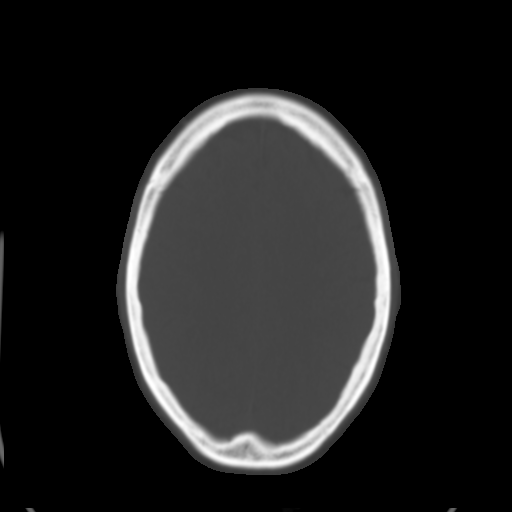
[im 25/34  brain]
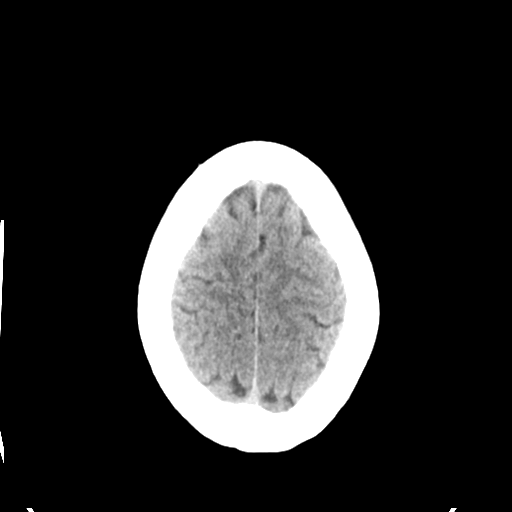
[im 29/34  brain]
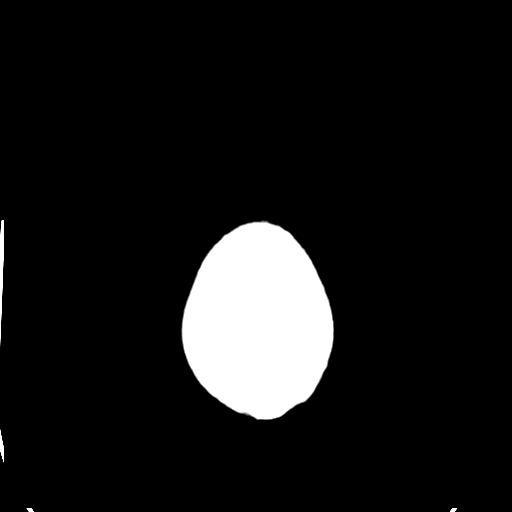

[Series 4: head bone · axial · 0.46mm/px · z∈[-112,-54]mm · 4 of 84 slices shown]
[im 9/84  bone]
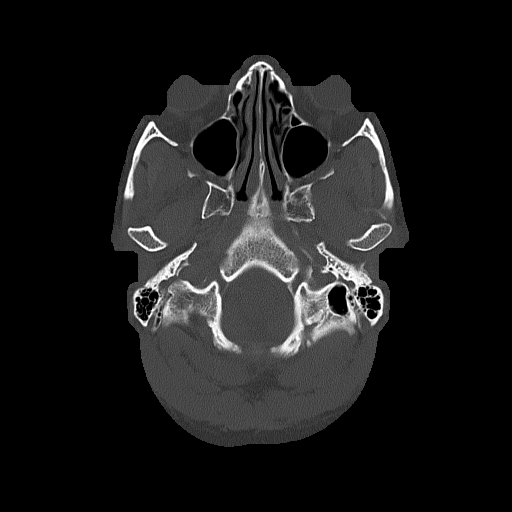
[im 17/84  bone]
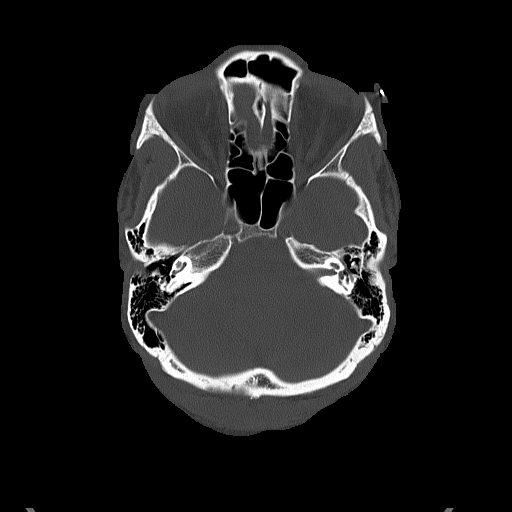
[im 25/84  bone]
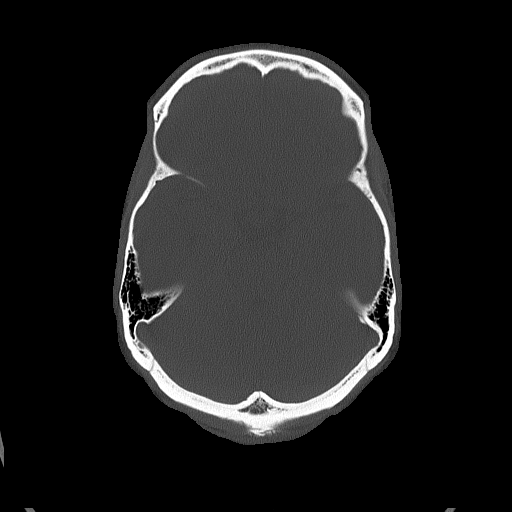
[im 38/84  bone]
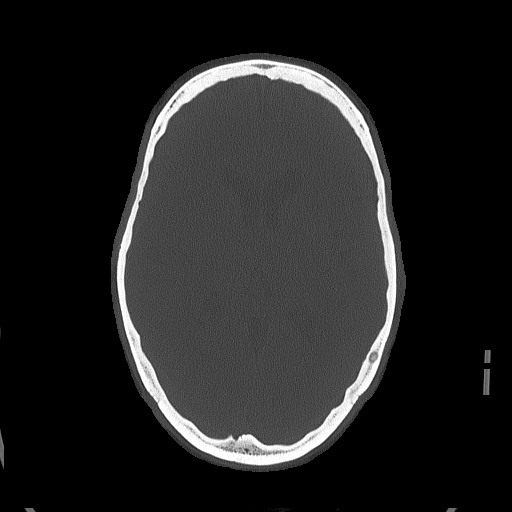

[Series 5: head without cor · coronal · non-contrast · 0.34mm/px · 3 of 72 slices shown]
[im 24/72  brain]
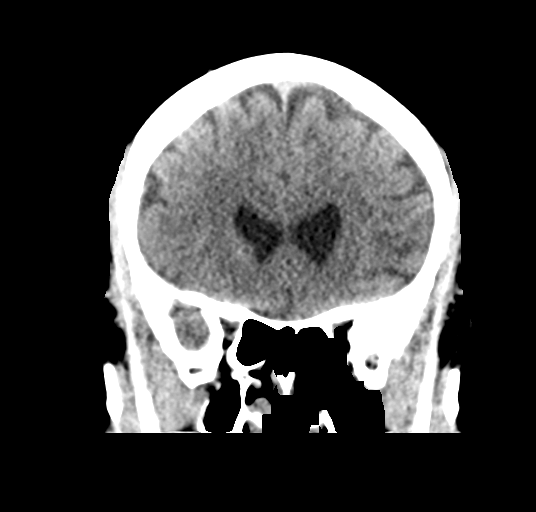
[im 32/72  brain]
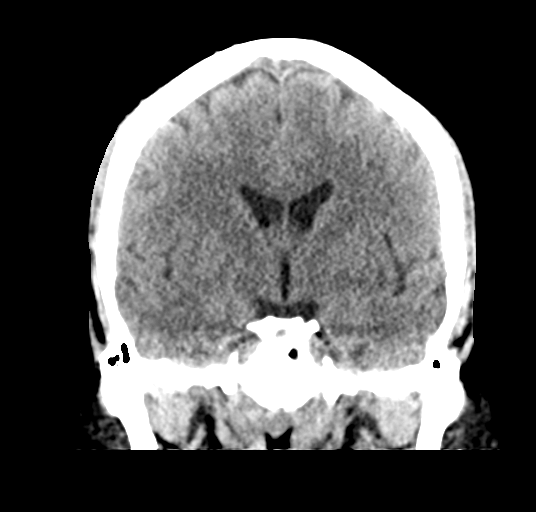
[im 40/72  brain]
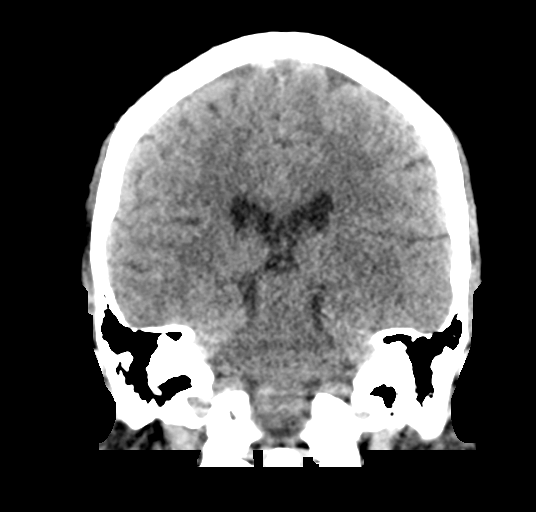

[Series 6: head without sag · sagittal · non-contrast · 0.33mm/px · 3 of 55 slices shown]
[im 19/55  brain]
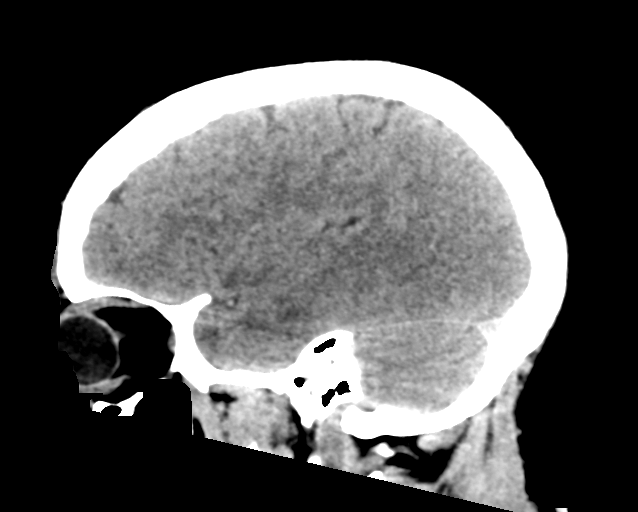
[im 28/55  brain]
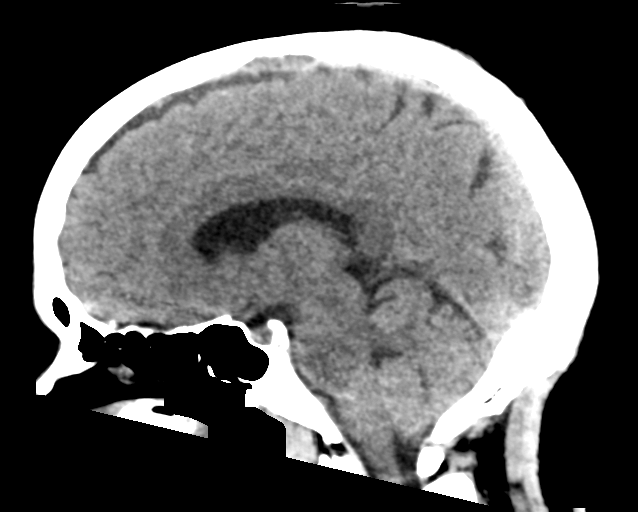
[im 37/55  brain]
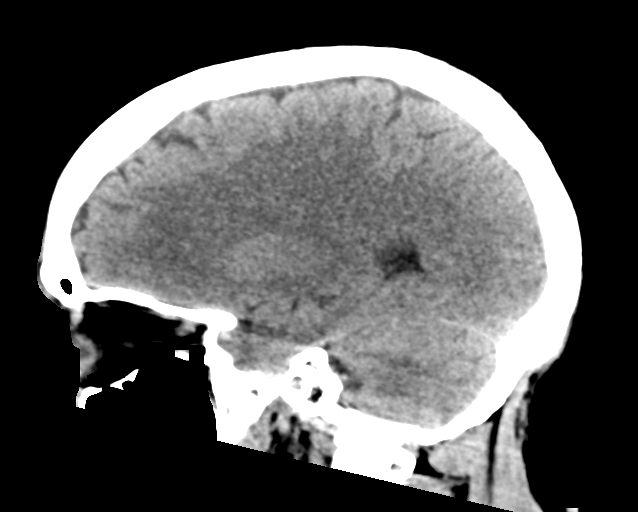

[17 of 47 positions shown; findings below may reference images not displayed]

FINDINGS: Brain: Normal ventricular morphology. No midline shift or mass
effect. Normal appearance of brain parenchyma. No intracranial
hemorrhage, mass lesion, evidence of acute infarction, or
extra-axial fluid collection.

Vascular: No hyperdense vessels

Skull: Intact

Sinuses/Orbits: Clear

Other: N/A
IMPRESSION: Normal exam.

## 2023-02-04 ENCOUNTER — Encounter: Payer: Self-pay | Admitting: Nurse Practitioner

## 2023-02-04 ENCOUNTER — Ambulatory Visit: Payer: Medicaid Other | Attending: Nurse Practitioner | Admitting: Nurse Practitioner

## 2023-02-04 VITALS — BP 120/75 | HR 74 | Ht 65.0 in | Wt 225.0 lb

## 2023-02-04 DIAGNOSIS — M7918 Myalgia, other site: Secondary | ICD-10-CM

## 2023-02-04 DIAGNOSIS — Z862 Personal history of diseases of the blood and blood-forming organs and certain disorders involving the immune mechanism: Secondary | ICD-10-CM | POA: Diagnosis not present

## 2023-02-04 DIAGNOSIS — I10 Essential (primary) hypertension: Secondary | ICD-10-CM | POA: Diagnosis not present

## 2023-02-04 DIAGNOSIS — Z23 Encounter for immunization: Secondary | ICD-10-CM | POA: Diagnosis not present

## 2023-02-04 DIAGNOSIS — E785 Hyperlipidemia, unspecified: Secondary | ICD-10-CM | POA: Diagnosis not present

## 2023-02-04 NOTE — Progress Notes (Unsigned)
Assessment & Plan:  [name] was seen today for medical management of chronic issues.  Diagnoses and all orders for this visit:  Primary hypertension -     CMP14+EGFR Continue HCTZ as prescribed.  Reminded to bring in blood pressure log for follow  up appointment.  RECOMMENDATIONS: DASH/Mediterranean Diets are healthier choices for HTN.    History of anemia -     CBC with Differential  Dyslipidemia, goal LDL below 100 LDL at goal -     Lipid panel Lab Results  Component Value Date   LDLCALC 79 02/04/2023     Encounter for immunization -     Flu vaccine trivalent PF, 6mos and older(Flulaval,Afluria,Fluarix,Fluzone)    Patient has been counseled on age-appropriate routine health concerns for screening and prevention. These are reviewed and up-to-date. Referrals have been placed accordingly. Immunizations are up-to-date or declined.    Subjective:   Chief Complaint  Patient presents with   Medical Management of Chronic Issues   HPI [name] 47 y.o. female presents to office today for follow up to HTN  She is accompanied by her husband today  She has a past medical history of Anemia, Anxiety, Arthritis, Bronchitis, Depressed, Family history of adverse reaction to anesthesia, Heart murmur, Hypertension, Palpitations, and Strep pharyngitis.   Patient has been counseled on age-appropriate routine health concerns for screening and prevention. These are reviewed and up-to-date. Referrals have been placed accordingly. Immunizations are up-to-date or declined.     MAMMOGRAM: OVERDUE. Referral placed and number to office given COLON CANCER SCREENING: UTD PAP SMEAR: UTD  HTN Blood pressure is well controlled. She is taking  hydrochlorothiazide as prescribed. Only takes cardizem as needed for palpitations.  BP Readings from Last 3 Encounters:  02/04/23 120/75  06/26/22 120/76  06/22/22 133/82     Review of Systems  Constitutional:  Negative for fever, malaise/fatigue  and weight loss.  HENT: Negative.  Negative for nosebleeds.   Eyes: Negative.  Negative for blurred vision, double vision and photophobia.  Respiratory: Negative.  Negative for cough and shortness of breath.   Cardiovascular: Negative.  Negative for chest pain, palpitations and leg swelling.  Gastrointestinal: Negative.  Negative for heartburn, nausea and vomiting.  Musculoskeletal: Negative.  Negative for myalgias.  Neurological: Negative.  Negative for dizziness, focal weakness, seizures and headaches.  Psychiatric/Behavioral: Negative.  Negative for suicidal ideas.     Past Medical History:  Diagnosis Date   Anemia    Anxiety    Arthritis    right foot   Bronchitis    Depressed    Family history of adverse reaction to anesthesia    twin sister - N/V   Heart murmur    was informed of this when she was a teen, has not seen a cardiologist or had an ECHO   Hypertension    Palpitations    Strep pharyngitis     Past Surgical History:  Procedure Laterality Date   CHOLECYSTECTOMY N/A 03/16/2021   Procedure: LAPAROSCOPIC CHOLECYSTECTOMY WITH INTRAOPERATIVE CHOLANGIOGRAM;  Surgeon: Griselda Miner, MD;  Location: [facility] OR;  Service: General;  Laterality: N/A;  90 MINUTES   CORONARY ANGIOPLASTY     NO PAST SURGERIES     oral surgery      Family History  Problem Relation Age of Onset   Heart disease Mother    Bronchitis Mother    Hypertension Sister    Diabetes Brother    Asthma Other    Hypertension Other  Social History Reviewed with no changes to be made today.   Outpatient Medications Prior to Visit  Medication Sig Dispense Refill   diltiazem (CARDIZEM) 60 MG tablet Take 1 tablet (60 mg total) by mouth 2 (two) times daily as needed for up to 30 doses (palpitations). 30 tablet 0   escitalopram (LEXAPRO) 20 MG tablet Take 2 tablets (40 mg total) by mouth daily. 180 tablet 1   fluticasone (FLONASE) 50 MCG/ACT nasal spray Place 1 spray into both nostrils daily. 16 g 0    hydrochlorothiazide (HYDRODIURIL) 25 MG tablet Take 1 tablet (25 mg total) by mouth daily. 90 tablet 3   hydrOXYzine (VISTARIL) 25 MG capsule Take 1 capsule (25 mg total) by mouth every 8 (eight) hours as needed. For anxiety. May take 2 capsules at night if needed for insomnia or lack of sleep 90 capsule 6   potassium chloride (KLOR-CON M) 10 MEQ tablet Take 1 tablet (10 mEq total) by mouth daily. 90 tablet 3   tiZANidine (ZANAFLEX) 4 MG tablet Take 1 tablet (4 mg total) by mouth every 6 (six) hours as needed for muscle spasms. 60 tablet 0   VENTOLIN HFA 108 (90 Base) MCG/ACT inhaler SMARTSIG:2 Puff(s) By Mouth 4 Times Daily PRN     No facility-administered medications prior to visit.    No Known Allergies     Objective:    BP 120/75 (BP Location: Left Arm, Patient Position: Sitting, Cuff Size: Normal)   Pulse 74   Ht 5\' 5"  (1.651 m)   Wt 225 lb (102.1 kg)   LMP 01/29/2023 (Approximate)   SpO2 97%   BMI 37.44 kg/m  Wt Readings from Last 3 Encounters:  02/04/23 225 lb (102.1 kg)  06/26/22 226 lb 6.4 oz (102.7 kg)  06/22/22 227 lb 3.2 oz (103.1 kg)    Physical Exam Vitals and nursing note reviewed.  Constitutional:      Appearance: She is well-developed.  HENT:     Head: Normocephalic and atraumatic.  Cardiovascular:     Rate and Rhythm: Normal rate and regular rhythm.     Heart sounds: Murmur heard.     No friction rub. No gallop.  Pulmonary:     Effort: Pulmonary effort is normal. No tachypnea or respiratory distress.     Breath sounds: Normal breath sounds. No decreased breath sounds, wheezing, rhonchi or rales.  Chest:     Chest wall: No tenderness.  Abdominal:     General: Bowel sounds are normal.     Palpations: Abdomen is soft.  Musculoskeletal:        General: Normal range of motion.     Cervical back: Normal range of motion.  Skin:    General: Skin is warm and dry.  Neurological:     Mental Status: She is alert and oriented to person, place, and time.      Coordination: Coordination normal.  Psychiatric:        Behavior: Behavior normal. Behavior is cooperative.        Thought Content: Thought content normal.        Judgment: Judgment normal.          Patient has been counseled extensively about nutrition and exercise as well as the importance of adherence with medications and regular follow-up. The patient was given clear instructions to go to ER or return to medical center if symptoms don't improve, worsen or new problems develop. The patient verbalized understanding.   Follow-up: Return in about 6 months (around  08/04/2023).   Claiborne Rigg, FNP-BC [facility] [place], [place] [phone]   02/07/2023, 2:23 PM

## 2023-02-04 NOTE — Patient Instructions (Signed)
[facility] Located in: [facility] Address: [address], [place], [place] [address] Phone: [phone]

## 2023-02-05 LAB — CBC WITH DIFFERENTIAL/PLATELET
Basophils Absolute: 0 10*3/uL (ref 0.0–0.2)
Basos: 0 %
EOS (ABSOLUTE): 0.1 10*3/uL (ref 0.0–0.4)
Eos: 1 %
Hematocrit: 26.8 % — ABNORMAL LOW (ref 34.0–46.6)
Hemoglobin: 7.8 g/dL — ABNORMAL LOW (ref 11.1–15.9)
Immature Grans (Abs): 0 10*3/uL (ref 0.0–0.1)
Immature Granulocytes: 0 %
Lymphocytes Absolute: 1.6 10*3/uL (ref 0.7–3.1)
Lymphs: 18 %
MCH: 21.7 pg — ABNORMAL LOW (ref 26.6–33.0)
MCHC: 29.1 g/dL — ABNORMAL LOW (ref 31.5–35.7)
MCV: 74 fL — ABNORMAL LOW (ref 79–97)
Monocytes Absolute: 1 10*3/uL — ABNORMAL HIGH (ref 0.1–0.9)
Monocytes: 11 %
Neutrophils Absolute: 6.2 10*3/uL (ref 1.4–7.0)
Neutrophils: 70 %
Platelets: 500 10*3/uL — ABNORMAL HIGH (ref 150–450)
RBC: 3.6 x10E6/uL — ABNORMAL LOW (ref 3.77–5.28)
RDW: 15.9 % — ABNORMAL HIGH (ref 11.7–15.4)
WBC: 8.9 10*3/uL (ref 3.4–10.8)

## 2023-02-05 LAB — CMP14+EGFR
ALT: 15 IU/L (ref 0–32)
AST: 20 IU/L (ref 0–40)
Albumin: 3.6 g/dL — ABNORMAL LOW (ref 3.9–4.9)
Alkaline Phosphatase: 74 IU/L (ref 44–121)
BUN/Creatinine Ratio: 7 — ABNORMAL LOW (ref 9–23)
BUN: 6 mg/dL (ref 6–24)
Bilirubin Total: 0.3 mg/dL (ref 0.0–1.2)
CO2: 25 mmol/L (ref 20–29)
Calcium: 8.8 mg/dL (ref 8.7–10.2)
Chloride: 101 mmol/L (ref 96–106)
Creatinine, Ser: 0.89 mg/dL (ref 0.57–1.00)
Globulin, Total: 4.4 g/dL (ref 1.5–4.5)
Glucose: 94 mg/dL (ref 70–99)
Potassium: 3.8 mmol/L (ref 3.5–5.2)
Sodium: 138 mmol/L (ref 134–144)
Total Protein: 8 g/dL (ref 6.0–8.5)
eGFR: 80 mL/min/{1.73_m2} (ref >59)

## 2023-02-05 LAB — LIPID PANEL
Chol/HDL Ratio: 3.8 ratio (ref 0.0–4.4)
Cholesterol, Total: 128 mg/dL (ref 100–199)
HDL: 34 mg/dL — ABNORMAL LOW (ref >39)
LDL Chol Calc (NIH): 79 mg/dL (ref 0–99)
Triglycerides: 73 mg/dL (ref 0–149)
VLDL Cholesterol Cal: 15 mg/dL (ref 5–40)

## 2023-02-07 ENCOUNTER — Other Ambulatory Visit: Payer: Self-pay | Admitting: Nurse Practitioner

## 2023-02-07 ENCOUNTER — Encounter: Payer: Self-pay | Admitting: Nurse Practitioner

## 2023-02-07 DIAGNOSIS — D649 Anemia, unspecified: Secondary | ICD-10-CM

## 2023-02-07 MED ORDER — TIZANIDINE HCL 4 MG PO TABS
4.0000 mg | ORAL_TABLET | Freq: Four times a day (QID) | ORAL | 2 refills | Status: AC | PRN
Start: 2023-02-07 — End: ?

## 2023-02-11 ENCOUNTER — Telehealth: Payer: Self-pay | Admitting: Nurse Practitioner

## 2023-02-11 ENCOUNTER — Ambulatory Visit: Payer: Medicaid Other | Attending: Nurse Practitioner

## 2023-02-11 DIAGNOSIS — D649 Anemia, unspecified: Secondary | ICD-10-CM

## 2023-02-11 NOTE — Telephone Encounter (Signed)
Copied from CRM [id number]. Topic: General - Other >> Feb 11, 2023 10:55 AM Franchot Heidelberg wrote: Reason for CRM: Pt wants to be contacted today when her paperwork is completed for her emotional support service companion. Pt says the deadline is today

## 2023-02-12 LAB — CBC WITH DIFFERENTIAL/PLATELET
Basophils Absolute: 0 x10E3/uL (ref 0.0–0.2)
Basos: 0 %
EOS (ABSOLUTE): 0.1 x10E3/uL (ref 0.0–0.4)
Eos: 1 %
Hematocrit: 28.6 % — ABNORMAL LOW (ref 34.0–46.6)
Hemoglobin: 8.2 g/dL — ABNORMAL LOW (ref 11.1–15.9)
Immature Grans (Abs): 0 x10E3/uL (ref 0.0–0.1)
Immature Granulocytes: 0 %
Lymphocytes Absolute: 2.3 x10E3/uL (ref 0.7–3.1)
Lymphs: 26 %
MCH: 21.6 pg — ABNORMAL LOW (ref 26.6–33.0)
MCHC: 28.7 g/dL — ABNORMAL LOW (ref 31.5–35.7)
MCV: 76 fL — ABNORMAL LOW (ref 79–97)
Monocytes Absolute: 1.6 x10E3/uL — ABNORMAL HIGH (ref 0.1–0.9)
Monocytes: 18 %
Neutrophils Absolute: 4.8 x10E3/uL (ref 1.4–7.0)
Neutrophils: 55 %
Platelets: 541 x10E3/uL — ABNORMAL HIGH (ref 150–450)
RBC: 3.79 x10E6/uL (ref 3.77–5.28)
RDW: 15.8 % — ABNORMAL HIGH (ref 11.7–15.4)
WBC: 8.9 x10E3/uL (ref 3.4–10.8)

## 2023-02-12 NOTE — Telephone Encounter (Signed)
Pt is calling to f/u on forms for her emotional support service companion. Pt is requesting a callback as soon as possible.   Please advise.

## 2023-02-13 NOTE — Telephone Encounter (Signed)
Return call to patient unanswered.

## 2023-02-13 NOTE — Telephone Encounter (Signed)
Patient is aware that Ms. Meredeth Ide had filled out forms for an emotional support animal. Patient was requesting additional accommodations for a bigger unit to live in. I did explain that Ms.Meredeth Ide does not fill out forms for that.

## 2023-02-17 ENCOUNTER — Other Ambulatory Visit: Payer: Self-pay | Admitting: Nurse Practitioner

## 2023-02-17 DIAGNOSIS — D649 Anemia, unspecified: Secondary | ICD-10-CM

## 2023-02-17 MED ORDER — IRON (FERROUS SULFATE) 325 (65 FE) MG PO TABS
325.0000 mg | ORAL_TABLET | Freq: Two times a day (BID) | ORAL | 1 refills | Status: AC
Start: 2023-02-17 — End: ?

## 2023-03-12 ENCOUNTER — Encounter (HOSPITAL_COMMUNITY): Payer: Self-pay

## 2023-03-14 ENCOUNTER — Ambulatory Visit (HOSPITAL_COMMUNITY): Payer: Medicaid Other | Admitting: Clinical

## 2023-04-02 ENCOUNTER — Telehealth: Payer: Self-pay | Admitting: Nurse Practitioner

## 2023-04-02 NOTE — Telephone Encounter (Signed)
Left Message - No MD here can certify she is disabled and needs emotional support animal. If apartment complex is unable to take my signature the forms will not be able to be completed.If disabled through mental health the psychiatrist can fill out form as an MD.

## 2023-04-02 NOTE — Telephone Encounter (Signed)
Pt returned missed call. Read to pt message below. Pt was upset and asked if Dr. Alvis Lemmings couldn't sigh. Then she stated she would like to pick up the forms; this way she would take them to her mental health provider to see if they could help her. Asked if Ms. Meredeth Ide had signed the forms.  Please advise.

## 2023-04-02 NOTE — Telephone Encounter (Signed)
Return call unanswered by patient. Forms are available for pick up at front desk unsigned.

## 2023-04-15 DIAGNOSIS — Z3189 Encounter for other procreative management: Secondary | ICD-10-CM | POA: Insufficient documentation

## 2023-05-18 ENCOUNTER — Other Ambulatory Visit: Payer: Self-pay | Admitting: Nurse Practitioner

## 2023-05-18 DIAGNOSIS — F419 Anxiety disorder, unspecified: Secondary | ICD-10-CM

## 2023-08-05 ENCOUNTER — Ambulatory Visit: Payer: Medicaid Other | Admitting: Nurse Practitioner

## 2023-08-16 ENCOUNTER — Other Ambulatory Visit: Payer: Self-pay

## 2023-08-16 ENCOUNTER — Encounter (HOSPITAL_COMMUNITY): Payer: Self-pay

## 2023-08-16 ENCOUNTER — Emergency Department (HOSPITAL_COMMUNITY)
Admission: EM | Admit: 2023-08-16 | Discharge: 2023-08-16 | Disposition: A | Discharge status: Home / Self Care | Attending: Emergency Medicine | Admitting: Emergency Medicine

## 2023-08-16 DIAGNOSIS — I472 Ventricular tachycardia, unspecified: Secondary | ICD-10-CM | POA: Insufficient documentation

## 2023-08-16 DIAGNOSIS — I4729 Other ventricular tachycardia: Secondary | ICD-10-CM

## 2023-08-16 DIAGNOSIS — R002 Palpitations: Secondary | ICD-10-CM

## 2023-08-16 MED ORDER — HYDROXYZINE HCL 25 MG PO TABS: 25.0000 mg | ORAL_TABLET | Freq: Four times a day (QID) | ORAL | 0 refills | Status: DC

## 2023-08-16 MED ORDER — HYDROXYZINE HCL 25 MG PO TABS
25.0000 mg | ORAL_TABLET | Freq: Once | ORAL | Status: AC
  Administered 2023-08-16: 25 mg via ORAL
  Filled 2023-08-16: qty 1

## 2023-08-16 MED ORDER — DILTIAZEM HCL 60 MG PO TABS: 60.0000 mg | ORAL_TABLET | Freq: Two times a day (BID) | ORAL | 0 refills | Status: AC | PRN

## 2023-08-16 NOTE — ED Provider Notes (Signed)
 [place] EMERGENCY DEPARTMENT AT [facility] Provider Note   CSN: [id number] Arrival date & time: 08/16/23  2204     History Chief Complaint  Patient presents with   Anxiety    HPI Cynde Menard is a 48 y.o. female presenting for chief complaint of palpitations and self-described panic attack.  States that she felt her heart start racing and she started hyperventilating.  Started having shooting sensation down her arms.  It is now resolved. Denies fevers chills nausea vomiting shortness of breath.  She is otherwise ambulatory tolerating p.o. intake. States has history of similar in the past prescribed hydroxyzine but she did not take it tonight.  Episode currently resolved.  No chest pain at any time..   Patient's recorded medical, surgical, social, medication list and allergies were reviewed in the Snapshot window as part of the initial history.   Review of Systems   Review of Systems  Constitutional:  Negative for chills and fever.  HENT:  Negative for ear pain and sore throat.   Eyes:  Negative for pain and visual disturbance.  Respiratory:  Negative for cough and shortness of breath.   Cardiovascular:  Positive for palpitations. Negative for chest pain.  Gastrointestinal:  Negative for abdominal pain and vomiting.  Genitourinary:  Negative for dysuria and hematuria.  Musculoskeletal:  Negative for arthralgias and back pain.  Skin:  Negative for color change and rash.  Neurological:  Negative for seizures and syncope.  All other systems reviewed and are negative.   Physical Exam Updated Vital Signs BP 138/86   Pulse 63   Temp 98.2 F (36.8 C) (Oral)   Resp 18   Ht 5\' 5"  (1.651 m)   Wt 101.2 kg   SpO2 99%   BMI 37.11 kg/m  Physical Exam Constitutional:      General: She is not in acute distress.    Appearance: She is not ill-appearing or toxic-appearing.  HENT:     Head: Normocephalic and atraumatic.  Eyes:     Extraocular Movements: Extraocular  movements intact.     Pupils: Pupils are equal, round, and reactive to light.  Cardiovascular:     Rate and Rhythm: Normal rate.  Pulmonary:     Effort: No respiratory distress.  Abdominal:     General: Abdomen is flat.  Musculoskeletal:        General: No swelling, deformity or signs of injury.     Cervical back: Normal range of motion. No rigidity.  Skin:    General: Skin is warm and dry.  Neurological:     General: No focal deficit present.     Mental Status: She is alert and oriented to person, place, and time.  Psychiatric:        Mood and Affect: Mood normal.      ED Course/ Medical Decision Making/ A&P    Procedures Procedures   Medications Ordered in ED Medications  hydrOXYzine (ATARAX) tablet 25 mg (25 mg Oral Given 08/16/23 2331)    Medical Decision Making:    Dacota Devall is a 48 y.o. female who presented to the ED today with palpitations detailed above.    Complete initial physical exam performed, notably the patient  was hemodynamically stable no acute distress.      Reviewed and confirmed nursing documentation for past medical history, family history, social history.    Initial Assessment:   With the patient's presentation of palpitations, most likely diagnosis is nonspecific etiology. Other diagnoses were considered including (but  not limited to) SVT, A-fib RVR, a flutter. These are considered less likely due to history of present illness and physical exam findings.   This is most consistent with an acute life/limb threatening illness complicated by underlying chronic conditions. Lack of pain makes pneumothorax, ACS, pulmonary embolism considered grossly less likely especially in the setting of recent normal vitals Initial Plan:  EKG to evaluate for cardiac pathology. Objective evaluation as below reviewed with plan for close reassessment  Initial Study Results:   EKG EKG was reviewed independently. Rate, rhythm, axis, intervals all examined and  without medically relevant abnormality. ST segments without concerns for elevations.    Reassessment and Plan:   Patient observed in the emergency department for an extended amount of time. EKG showed no focal pathology.  Symptoms remained resolved and patient feels stable for outpatient care and management. Disposition:  I have considered need for hospitalization, however, considering all of the above, I believe this patient is stable for discharge at this time.  Patient/family educated about specific return precautions for given chief complaint and symptoms.  Patient/family educated about follow-up with PCP.     Patient/family expressed understanding of return precautions and need for follow-up. Patient spoken to regarding all imaging and laboratory results and appropriate follow up for these results. All education provided in verbal form with additional information in written form. Time was allowed for answering of patient questions. Patient discharged.    Emergency Department Medication Summary:   Medications  hydrOXYzine (ATARAX) tablet 25 mg (25 mg Oral Given 08/16/23 2331)         Clinical Impression:  1. Palpitations   2. NSVT (nonsustained ventricular tachycardia) [facility])      Discharge   Final Clinical Impression(s) / ED Diagnoses Final diagnoses:  Palpitations    Rx / DC Orders ED Discharge Orders          Ordered    diltiazem (CARDIZEM) 60 MG tablet  2 times daily PRN        08/16/23 2313    hydrOXYzine (ATARAX) 25 MG tablet  Every 6 hours        08/16/23 2313              Glyn Ade, MD 08/16/23 2354

## 2023-08-16 NOTE — ED Triage Notes (Signed)
 BIB EMS from home for anxiety. Pt reports she has a strange feeling in her head and her arms. No pain noted.

## 2023-08-22 ENCOUNTER — Other Ambulatory Visit: Payer: Self-pay | Admitting: Nurse Practitioner

## 2023-08-26 ENCOUNTER — Ambulatory Visit (HOSPITAL_COMMUNITY): Payer: Medicaid Other | Admitting: Clinical

## 2023-10-08 ENCOUNTER — Other Ambulatory Visit: Payer: Self-pay

## 2023-10-24 ENCOUNTER — Other Ambulatory Visit: Payer: Self-pay | Admitting: Nurse Practitioner

## 2023-10-24 NOTE — Telephone Encounter (Signed)
 Copied from CRM [id number]. Topic: Clinical - Medication Refill >> Oct 24, 2023  3:32 PM Chrystal Crape R wrote: Medication: hydrOXYzine  (ATARAX ) 25 MG tablet [[id number]]  Has the patient contacted their pharmacy? Yes, they stated there were no refills (Agent: If no, request that the patient contact the pharmacy for the refill. If patient does not wish to contact the pharmacy document the reason why and proceed with request.) (Agent: If yes, when and what did the pharmacy advise?)  This is the patient's preferred pharmacy:  [facility] [address] ([address] [place] - [address] [place] ([place]) [place] [address] Phone: [phone] Fax: [phone]  Is this the correct pharmacy for this prescription? Yes If no, delete pharmacy and type the correct one.   Has the prescription been filled recently? Yes in the hospital however three months ago with NP Zelda  Is the patient out of the medication? Yes  Has the patient been seen for an appointment in the last year OR does the patient have an upcoming appointment? Yes  Can we respond through MyChart? Yes  Agent: Please be advised that Rx refills may take up to 3 business days. We ask that you follow-up with your pharmacy.

## 2023-10-25 NOTE — Telephone Encounter (Signed)
 Requested medications are due for refill today.  yes  Requested medications are on the active medications list.  yes  Last refill. 08/16/2023 #12 0 rf  Future visit scheduled.   no  Notes to clinic.  Rx signed by Dr. Urban Garden.    Requested Prescriptions  Pending Prescriptions Disp Refills   hydrOXYzine  (ATARAX ) 25 MG tablet 12 tablet 0    Sig: Take 1 tablet (25 mg total) by mouth every 6 (six) hours.     Ear, Nose, and Throat:  Antihistamines 2 Passed - 10/25/2023  5:58 PM      Passed - Cr in normal range and within 360 days    Creat  Date Value Ref Range Status  01/27/2015 0.82 0.50 - 1.10 mg/dL Final   Creatinine, Ser  Date Value Ref Range Status  02/04/2023 0.89 0.57 - 1.00 mg/dL Final         Passed - Valid encounter within last 12 months    Recent Outpatient Visits           8 months ago Primary hypertension   [facility] [place] Collins Dean, NP   1 year ago Encounter for annual physical exam   [facility] [place] Collins Dean, NP   1 year ago Encounter to establish care   [facility] [place]. [facility] Collins Dean, [place]

## 2023-11-09 LAB — HM MAMMOGRAPHY

## 2023-11-20 ENCOUNTER — Encounter: Payer: Self-pay | Admitting: Family Medicine

## 2023-11-20 ENCOUNTER — Ambulatory Visit: Payer: Self-pay | Admitting: Nurse Practitioner

## 2023-12-02 ENCOUNTER — Telehealth: Payer: Self-pay | Admitting: Nurse Practitioner

## 2023-12-02 NOTE — Telephone Encounter (Signed)
Contacted pt confirmed appt

## 2023-12-03 ENCOUNTER — Ambulatory Visit: Attending: Nurse Practitioner | Admitting: Nurse Practitioner

## 2023-12-03 ENCOUNTER — Ambulatory Visit
Admission: RE | Admit: 2023-12-03 | Discharge: 2023-12-03 | Disposition: A | Discharge status: Home / Self Care | Source: Ambulatory Visit | Attending: Nurse Practitioner | Admitting: Nurse Practitioner

## 2023-12-03 ENCOUNTER — Encounter: Payer: Self-pay | Admitting: Nurse Practitioner

## 2023-12-03 VITALS — BP 115/72 | HR 63 | Resp 19 | Ht 65.0 in | Wt 218.2 lb

## 2023-12-03 DIAGNOSIS — K089 Disorder of teeth and supporting structures, unspecified: Secondary | ICD-10-CM

## 2023-12-03 DIAGNOSIS — M545 Low back pain, unspecified: Secondary | ICD-10-CM

## 2023-12-03 DIAGNOSIS — G8929 Other chronic pain: Secondary | ICD-10-CM

## 2023-12-03 DIAGNOSIS — F32A Depression, unspecified: Secondary | ICD-10-CM

## 2023-12-03 DIAGNOSIS — F419 Anxiety disorder, unspecified: Secondary | ICD-10-CM | POA: Diagnosis not present

## 2023-12-03 DIAGNOSIS — I10 Essential (primary) hypertension: Secondary | ICD-10-CM

## 2023-12-03 MED ORDER — METHOCARBAMOL 500 MG PO TABS: 500.0000 mg | ORAL_TABLET | Freq: Four times a day (QID) | ORAL | 1 refills | Status: AC

## 2023-12-03 MED ORDER — HYDROCHLOROTHIAZIDE 25 MG PO TABS: 25.0000 mg | ORAL_TABLET | Freq: Every day | ORAL | 1 refills | Status: AC

## 2023-12-03 MED ORDER — CHLORHEXIDINE GLUCONATE 0.12 % MT SOLN: 15.0000 mL | Freq: Two times a day (BID) | OROMUCOSAL | 0 refills | Status: AC

## 2023-12-03 MED ORDER — MELOXICAM 15 MG PO TABS: 15.0000 mg | ORAL_TABLET | Freq: Every day | ORAL | 0 refills | Status: AC

## 2023-12-03 MED ORDER — HYDROXYZINE PAMOATE 25 MG PO CAPS: 25.0000 mg | ORAL_CAPSULE | Freq: Three times a day (TID) | ORAL | 6 refills | Status: AC | PRN

## 2023-12-03 NOTE — Patient Instructions (Signed)
 [facility] [address]  [phone]

## 2023-12-03 NOTE — Progress Notes (Signed)
 Assessment & Plan:  [name] was seen today for medical management of chronic issues.  Diagnoses and all orders for this visit:  Primary hypertension -     hydrochlorothiazide  (HYDRODIURIL ) 25 MG tablet; Take 1 tablet (25 mg total) by mouth daily. -     CMP14+EGFR Continue all antihypertensives as prescribed.  Reminded to bring in blood pressure log for follow  up appointment.  RECOMMENDATIONS: DASH/Mediterranean Diets are healthier choices for HTN.    Anxiety and depression -     hydrOXYzine  (VISTARIL ) 25 MG capsule; Take 1 capsule (25 mg total) by mouth every 8 (eight) hours as needed. For anxiety. May take 2 capsules at night if needed for insomnia or lack of sleep  Chronic bilateral low back pain without sciatica -     DG Lumbar Spine Complete; Future -     meloxicam  (MOBIC ) 15 MG tablet; Take 1 tablet (15 mg total) by mouth daily. TAKE WITH FOOD! FOR BACK PAIN -     methocarbamol  (ROBAXIN ) 500 MG tablet; Take 1 tablet (500 mg total) by mouth 4 (four) times daily. For muscle spasms  Work on losing weight to help reduce back pain. May alternate with heat and ice application for pain relief. May also alternate with acetaminophen  as prescribed for back pain. Other alternatives include massage, acupuncture and water aerobics.   Poor dentition -     chlorhexidine  (PERIDEX ) 0.12 % solution; Use as directed 15 mLs in the mouth or throat 2 (two) times daily.    Patient has been counseled on age-appropriate routine health concerns for screening and prevention. These are reviewed and up-to-date. Referrals have been placed accordingly. Immunizations are up-to-date or declined.    Subjective:   Chief Complaint  Patient presents with   Medical Management of Chronic Issues    Lower back pain     [name] 48 y.o. female presents to office today for follow up to HTN and with concerns of back pain.   She has a past medical history of Anemia, Anxiety, Arthritis, Bronchitis,  Depressed, Family history of adverse reaction to anesthesia, Heart murmur, Hypertension, Palpitations, and Strep pharyngitis.   Patient has been counseled on age-appropriate routine health concerns for screening and prevention. These are reviewed and up-to-date. Referrals have been placed accordingly. Immunizations are up-to-date or declined.     MAMMOGRAM: DUE COLON CANCER SCREENING: UTD PAP SMEAR: UTD  HTN Blood pressure is well controlled. She is taking hydrochlorothiazide  as prescribed. Only takes cardizem  as needed for palpitations.  BP Readings from Last 3 Encounters:  12/03/23 115/72  08/16/23 138/86  02/04/23 120/75     Back Pain Onset of pain 2-3 years ago.  Unrelated to any injury or trauma.  Pain described as burning and sharp.  Does not radiate down into the legs.  She has tried ibuprofen  for pain which was ineffective.  Rates pain 9 out of 10.  Denies any bowel or bladder incontinence.  Aggravating factors include prolonged standing and/or sitting.  Lying in bed or recumbent positions.    She noticed some right-sided jaw swelling recently that has almost resolved as of today.  Her husband states she gave her some eardrops that he had been given previously for similar symptoms.  She does have some dental work that needs to be completed.   Review of Systems  Constitutional:  Negative for fever, malaise/fatigue and weight loss.  HENT: Negative.  Negative for nosebleeds.   Eyes: Negative.  Negative for blurred vision, double vision and  photophobia.  Respiratory: Negative.  Negative for cough and shortness of breath.   Cardiovascular: Negative.  Negative for chest pain, palpitations and leg swelling.  Gastrointestinal: Negative.  Negative for heartburn, nausea and vomiting.  Musculoskeletal:  Positive for back pain and myalgias.  Neurological: Negative.  Negative for dizziness, focal weakness, seizures and headaches.  Psychiatric/Behavioral: Negative.  Negative for suicidal  ideas.     Past Medical History:  Diagnosis Date   Anemia    Anxiety    Arthritis    right foot   Bronchitis    Depressed    Family history of adverse reaction to anesthesia    twin sister - N/V   Heart murmur    was informed of this when she was a teen, has not seen a cardiologist or had an ECHO   Hypertension    Palpitations    Strep pharyngitis     Past Surgical History:  Procedure Laterality Date   CHOLECYSTECTOMY N/A 03/16/2021   Procedure: LAPAROSCOPIC CHOLECYSTECTOMY WITH INTRAOPERATIVE CHOLANGIOGRAM;  Surgeon: Curvin Deward MOULD, MD;  Location: [facility] OR;  Service: General;  Laterality: N/A;  90 MINUTES   CORONARY ANGIOPLASTY     NO PAST SURGERIES     oral surgery      Family History  Problem Relation Age of Onset   Heart disease Mother    Bronchitis Mother    Hypertension Sister    Diabetes Brother    Asthma Other    Hypertension Other     Social History Reviewed with no changes to be made today.   Outpatient Medications Prior to Visit  Medication Sig Dispense Refill   diltiazem  (CARDIZEM ) 60 MG tablet Take 1 tablet (60 mg total) by mouth 2 (two) times daily as needed for up to 30 doses (palpitations). 30 tablet 0   escitalopram  (LEXAPRO ) 20 MG tablet Take 1 tablet by mouth once daily 90 tablet 0   Iron , Ferrous Sulfate , 325 (65 Fe) MG TABS Take 325 mg by mouth 2 (two) times daily. 180 tablet 1   potassium chloride  (KLOR-CON  M) 10 MEQ tablet Take 1 tablet (10 mEq total) by mouth daily. 90 tablet 3   tiZANidine  (ZANAFLEX ) 4 MG tablet Take 1 tablet (4 mg total) by mouth every 6 (six) hours as needed for muscle spasms. 60 tablet 2   VENTOLIN  HFA 108 (90 Base) MCG/ACT inhaler SMARTSIG:2 Puff(s) By Mouth 4 Times Daily PRN     fluticasone  (FLONASE ) 50 MCG/ACT nasal spray Place 1 spray into both nostrils daily. 16 g 0   hydrochlorothiazide  (HYDRODIURIL ) 25 MG tablet Take 1 tablet by mouth once daily 90 tablet 0   hydrOXYzine  (ATARAX ) 25 MG tablet Take 1 tablet (25 mg  total) by mouth every 6 (six) hours. 12 tablet 0   hydrOXYzine  (VISTARIL ) 25 MG capsule Take 1 capsule (25 mg total) by mouth every 8 (eight) hours as needed. For anxiety. May take 2 capsules at night if needed for insomnia or lack of sleep 90 capsule 6   No facility-administered medications prior to visit.    Allergies  Allergen Reactions   Fentanyl  Other (See Comments)    Severe chest pain   Penicillin  G Other (See Comments)    Mother and sister allergic to PCN       Objective:    BP 115/72 (BP Location: Right Arm, Patient Position: Sitting, Cuff Size: Normal)   Pulse 63   Resp 19   Ht 5' 5 (1.651 m)   Wt 218 lb  3.2 oz (99 kg)   LMP  (Exact Date)   SpO2 97%   BMI 36.31 kg/m  Wt Readings from Last 3 Encounters:  12/03/23 218 lb 3.2 oz (99 kg)  08/16/23 223 lb (101.2 kg)  02/04/23 225 lb (102.1 kg)    Physical Exam Vitals and nursing note reviewed.  Constitutional:      Appearance: She is well-developed.  HENT:     Head: Normocephalic and atraumatic.     Mouth/Throat:     Dentition: Dental caries present.  Cardiovascular:     Rate and Rhythm: Normal rate and regular rhythm.     Heart sounds: Normal heart sounds. No murmur heard.    No friction rub. No gallop.  Pulmonary:     Effort: Pulmonary effort is normal. No tachypnea or respiratory distress.     Breath sounds: Normal breath sounds. No decreased breath sounds, wheezing, rhonchi or rales.  Chest:     Chest wall: No tenderness.  Musculoskeletal:        General: Normal range of motion.     Cervical back: Normal range of motion.  Skin:    General: Skin is warm and dry.  Neurological:     Mental Status: She is alert and oriented to person, place, and time.     Coordination: Coordination normal.  Psychiatric:        Behavior: Behavior normal. Behavior is cooperative.        Thought Content: Thought content normal.        Judgment: Judgment normal.          Patient has been counseled extensively about  nutrition and exercise as well as the importance of adherence with medications and regular follow-up. The patient was given clear instructions to go to ER or return to medical center if symptoms don't improve, worsen or new problems develop. The patient verbalized understanding.   Follow-up: Return in about 4 months (around 04/04/2024).   Haze LELON Servant, FNP-BC [facility] [place], [place] [phone]   12/03/2023, 2:55 PM

## 2023-12-04 ENCOUNTER — Ambulatory Visit: Payer: Self-pay | Admitting: Nurse Practitioner

## 2023-12-04 LAB — CMP14+EGFR
ALT: 24 IU/L (ref 0–32)
AST: 21 IU/L (ref 0–40)
Albumin: 3.8 g/dL — ABNORMAL LOW (ref 3.9–4.9)
Alkaline Phosphatase: 88 IU/L (ref 44–121)
BUN/Creatinine Ratio: 8 — ABNORMAL LOW (ref 9–23)
BUN: 8 mg/dL (ref 6–24)
Bilirubin Total: 0.2 mg/dL (ref 0.0–1.2)
CO2: 22 mmol/L (ref 20–29)
Calcium: 9.4 mg/dL (ref 8.7–10.2)
Chloride: 98 mmol/L (ref 96–106)
Creatinine, Ser: 0.97 mg/dL (ref 0.57–1.00)
Globulin, Total: 4.5 g/dL (ref 1.5–4.5)
Glucose: 85 mg/dL (ref 70–99)
Potassium: 4 mmol/L (ref 3.5–5.2)
Sodium: 137 mmol/L (ref 134–144)
Total Protein: 8.3 g/dL (ref 6.0–8.5)
eGFR: 72 mL/min/1.73 (ref >59)

## 2024-02-10 ENCOUNTER — Encounter: Payer: Self-pay | Admitting: Nurse Practitioner

## 2024-02-10 ENCOUNTER — Other Ambulatory Visit: Payer: Self-pay | Admitting: Nurse Practitioner

## 2024-02-10 DIAGNOSIS — F419 Anxiety disorder, unspecified: Secondary | ICD-10-CM

## 2024-02-12 ENCOUNTER — Other Ambulatory Visit: Payer: Self-pay | Admitting: Nurse Practitioner

## 2024-02-12 DIAGNOSIS — F32A Depression, unspecified: Secondary | ICD-10-CM

## 2024-05-27 ENCOUNTER — Telehealth: Payer: Self-pay | Admitting: Nurse Practitioner

## 2024-05-27 NOTE — Telephone Encounter (Signed)
 Contacted pt to confirmed appt (per vr)

## 2024-05-29 ENCOUNTER — Encounter: Payer: Self-pay | Admitting: *Deleted

## 2024-05-29 ENCOUNTER — Ambulatory Visit: Payer: Self-pay | Attending: *Deleted | Admitting: *Deleted

## 2024-05-29 VITALS — BP 136/76 | HR 65 | Temp 98.4°F | Ht 65.0 in | Wt 229.4 lb

## 2024-05-29 DIAGNOSIS — N912 Amenorrhea, unspecified: Secondary | ICD-10-CM

## 2024-05-29 LAB — POCT URINE PREGNANCY: Preg Test, Ur: NEGATIVE

## 2024-05-29 NOTE — Patient Instructions (Signed)
 Today we talked about your bilateral breast pain and pelvic pain with alteration in your menstrual cycle. Differentials for the breast pain include changes in your hormones, linked to your period, Cyst, infection, and referred pain from your chest wall, back or stomach. We also talked about breast pain related to caffeine use Your breast exam was unremarkable. You were concerned about pregnancy so we will proceed with a urine pregnancy test. Otherwise you have been encouraged to track your menstrual symptoms, decrease your caffeine intake. Use of heating pad may help Notify the clinic if your symptoms persist or worsen

## 2024-05-29 NOTE — Progress Notes (Signed)
 "   Patient ID: [name], female    DOB: [birth year]  MRN: [id number]  CC: No chief complaint on file.   Subjective: [name] is a 49 y.o. female who presents to discuss bilateral breast soreness, pelvic pain and alteration in her  monthly menstrual cycle (spotting)  She reports that she is status post multiple fibroid removal in April 2025.  This month she has noticed that she has bilateral breast soreness with no dominant masses or nipple discharge. She does drink multiple Cups of coffee a day. She had her mammogram last year and it was normal.   She has associated pelvic cramps with the breast pain.  No associated constipation or urinary tract symptom  She has noticed spotting this month which is unusual for her.  She denies any symptoms of vaginal infection.  She wonders if the change in her cycle could mean that she is pregnant and is requesting a urine pregnancy test  Her concerns today include: Hypertension, anxiety and depression, chronic bilateral low back pain without sciatica, poor dentition    Patient Active Problem List   Diagnosis Date Noted   Encounter for fertility planning 04/15/2023   Current severe episode of major depressive disorder without psychotic features (HCC) 09/03/2022   Calculus of gallbladder with chronic cholecystitis without obstruction 01/31/2021   Osteoarthritis of ankle and foot 05/24/2015   Coalition, tarsal 05/24/2015   Fibroid 01/27/2015   Unspecified disorder of menstruation and other abnormal bleeding from female genital tract 01/22/2014   Abnormal uterine bleeding (AUB) 01/22/2014   Cervical high risk human papillomavirus (HPV) DNA test positive 03/12/2013   Vaginitis and vulvovaginitis 09/03/2012     Medications Ordered Prior to Encounter[1]  Allergies[2]  Social History   Socioeconomic History   Marital status: Single    Spouse name: Not on file   Number of children: 0   Years of education: Not on file   Highest  education level: Not on file  Occupational History   Occupation: Part-time student    Comment: [employer]  Tobacco Use   Smoking status: Never   Smokeless tobacco: Never  Vaping Use   Vaping status: Never Used  Substance and Sexual Activity   Alcohol use: Never   Drug use: Never   Sexual activity: Yes    Partners: Male    Birth control/protection: None  Other Topics Concern   Not on file  Social History Narrative   Not on file   Social Drivers of Health   Tobacco Use: Low Risk (05/29/2024)   Patient History    Smoking Tobacco Use: Never    Smokeless Tobacco Use: Never    Passive Exposure: Not on file  Financial Resource Strain: Not on file  Food Insecurity: Not on file  Transportation Needs: Not on file  Physical Activity: Not on file  Stress: Not on file  Social Connections: Unknown (09/26/2021)   Received from [facility]   Social Network    Social Network: Not on file  Intimate Partner Violence: Not At Risk (10/15/2022)   Received from [facility]   Epic    Within the last year, have you been afraid of your partner or ex-partner?: No    Within the last year, have you been humiliated or emotionally abused in other ways by your partner or ex-partner?: No    Within the last year, have you been kicked, hit, slapped, or otherwise physically hurt by your partner or ex-partner?: No    Within the last  year, have you been raped or forced to have any kind of sexual activity by your partner or ex-partner?: No  Depression (PHQ2-9): High Risk (12/03/2023)   Depression (PHQ2-9)    PHQ-2 Score: 16  Alcohol Screen: Not on file  Housing: Not on file  Utilities: Not on file  Health Literacy: Not on file    Family History  Problem Relation Age of Onset   Heart disease Mother    Bronchitis Mother    Hypertension Sister    Diabetes Brother    Asthma Other    Hypertension Other     Past Surgical History:  Procedure Laterality Date   CHOLECYSTECTOMY N/A 03/16/2021   Procedure:  LAPAROSCOPIC CHOLECYSTECTOMY WITH INTRAOPERATIVE CHOLANGIOGRAM;  Surgeon: Curvin Deward MOULD, MD;  Location: [facility] OR;  Service: General;  Laterality: N/A;  90 MINUTES   CORONARY ANGIOPLASTY     NO PAST SURGERIES     oral surgery      ROS: Review of Systems Negative except as stated above  PHYSICAL EXAM: There were no vitals taken for this visit.  Physical Exam Vitals and nursing note reviewed.  Cardiovascular:     Rate and Rhythm: Normal rate and regular rhythm.  Pulmonary:     Effort: Pulmonary effort is normal.     Breath sounds: Normal breath sounds.  Chest:  Breasts:    Right: Normal.     Left: Normal.     Comments: No  dominant masses or nipple discharge Abdominal:     General: Abdomen is flat. Bowel sounds are normal.     Palpations: Abdomen is soft.  Skin:    General: Skin is warm and dry.  Neurological:     Mental Status: She is alert. Mental status is at baseline.          Latest Ref Rng & Units 12/03/2023    3:04 PM 02/04/2023    5:05 PM 03/23/2022    9:36 AM  CMP  Glucose 70 - 99 mg/dL 85  94  89   BUN 6 - 24 mg/dL 8  6  6    Creatinine 0.57 - 1.00 mg/dL 9.02  9.10  9.10   Sodium 134 - 144 mmol/L 137  138  135   Potassium 3.5 - 5.2 mmol/L 4.0  3.8  3.4   Chloride 96 - 106 mmol/L 98  101  99   CO2 20 - 29 mmol/L 22  25  26    Calcium 8.7 - 10.2 mg/dL 9.4  8.8  9.0   Total Protein 6.0 - 8.5 g/dL 8.3  8.0  8.0   Total Bilirubin 0.0 - 1.2 mg/dL 0.2  0.3  <9.7   Alkaline Phos 44 - 121 IU/L 88  74  75   AST 0 - 40 IU/L 21  20  15    ALT 0 - 32 IU/L 24  15  15     Lipid Panel     Component Value Date/Time   CHOL 128 02/04/2023 1705   TRIG 73 02/04/2023 1705   HDL 34 (L) 02/04/2023 1705   CHOLHDL 3.8 02/04/2023 1705   LDLCALC 79 02/04/2023 1705    CBC    Component Value Date/Time   WBC 8.9 02/11/2023 1427   WBC 8.0 05/06/2021 1943   RBC 3.79 02/11/2023 1427   RBC 3.99 05/06/2021 1943   HGB 8.2 (L) 02/11/2023 1427   HCT 28.6 (L) 02/11/2023 1427   PLT  541 (H) 02/11/2023 1427   MCV 76 (L) 02/11/2023 1427  MCH 21.6 (L) 02/11/2023 1427   MCH 30.8 05/06/2021 1943   MCHC 28.7 (L) 02/11/2023 1427   MCHC 33.3 05/06/2021 1943   RDW 15.8 (H) 02/11/2023 1427   LYMPHSABS 2.3 02/11/2023 1427   MONOABS 1.1 (H) 05/06/2021 1943   EOSABS 0.1 02/11/2023 1427   BASOSABS 0.0 02/11/2023 1427    Results for orders placed or performed in visit on 12/03/23  CMP14+EGFR   Collection Time: 12/03/23  3:04 PM  Result Value Ref Range   Glucose 85 70 - 99 mg/dL   BUN 8 6 - 24 mg/dL   Creatinine, Ser 9.02 0.57 - 1.00 mg/dL   eGFR 72 >40 fO/fpw/8.26   BUN/Creatinine Ratio 8 (L) 9 - 23   Sodium 137 134 - 144 mmol/L   Potassium 4.0 3.5 - 5.2 mmol/L   Chloride 98 96 - 106 mmol/L   CO2 22 20 - 29 mmol/L   Calcium 9.4 8.7 - 10.2 mg/dL   Total Protein 8.3 6.0 - 8.5 g/dL   Albumin 3.8 (L) 3.9 - 4.9 g/dL   Globulin, Total 4.5 1.5 - 4.5 g/dL   Bilirubin Total 0.2 0.0 - 1.2 mg/dL   Alkaline Phosphatase 88 44 - 121 IU/L   AST 21 0 - 40 IU/L   ALT 24 0 - 32 IU/L     ASSESSMENT AND PLAN:  Assessment & Plan Amenorrhea She reports changes in her menstrual cycle including spotting associated with breast tenderness and pelvic cramping. Her breast exam was normal without dominant masses or nipple discharge. She does admit to drinking more caffeine than usual. Abdominal exam also unremarkable. Most likely she is starting through perimenopause. She did request a urine pregnancy test which was negative. She is encouraged to decrease her caffeine intake.  Use heat for discomfort in the breast and pelvic pain and notify the clinic if her menstrual cycles  continue to change Orders:   POCT urine pregnancy       Patient was given the opportunity to ask questions.  Patient verbalized understanding of the plan and was able to repeat key elements of the plan.   This documentation was completed using Paediatric nurse.  Any transcriptional errors  are unintentional.     Requested Prescriptions    No prescriptions requested or ordered in this encounter    No follow-ups on file.  Yesenia Fontenette H, NP      [1]  Current Outpatient Medications on File Prior to Visit  Medication Sig Dispense Refill   chlorhexidine  (PERIDEX ) 0.12 % solution Use as directed 15 mLs in the mouth or throat 2 (two) times daily. 480 mL 0   diltiazem  (CARDIZEM ) 60 MG tablet Take 1 tablet (60 mg total) by mouth 2 (two) times daily as needed for up to 30 doses (palpitations). 30 tablet 0   escitalopram  (LEXAPRO ) 20 MG tablet Take 2 tablets by mouth once daily 180 tablet 0   hydrochlorothiazide  (HYDRODIURIL ) 25 MG tablet Take 1 tablet (25 mg total) by mouth daily. 90 tablet 1   hydrOXYzine  (VISTARIL ) 25 MG capsule Take 1 capsule (25 mg total) by mouth every 8 (eight) hours as needed. For anxiety. May take 2 capsules at night if needed for insomnia or lack of sleep 90 capsule 6   Iron , Ferrous Sulfate , 325 (65 Fe) MG TABS Take 325 mg by mouth 2 (two) times daily. 180 tablet 1   meloxicam  (MOBIC ) 15 MG tablet Take 1 tablet (15 mg total) by mouth daily. TAKE WITH FOOD! FOR  BACK PAIN 30 tablet 0   methocarbamol  (ROBAXIN ) 500 MG tablet Take 1 tablet (500 mg total) by mouth 4 (four) times daily. For muscle spasms 60 tablet 1   potassium chloride  (KLOR-CON  M) 10 MEQ tablet Take 1 tablet (10 mEq total) by mouth daily. 90 tablet 3   tiZANidine  (ZANAFLEX ) 4 MG tablet Take 1 tablet (4 mg total) by mouth every 6 (six) hours as needed for muscle spasms. 60 tablet 2   VENTOLIN  HFA 108 (90 Base) MCG/ACT inhaler SMARTSIG:2 Puff(s) By Mouth 4 Times Daily PRN     No current facility-administered medications on file prior to visit.  [2]  Allergies Allergen Reactions   Fentanyl  Other (See Comments)    Severe chest pain   Penicillin  G Other (See Comments)    Mother and sister allergic to PCN   "
